# Patient Record
Sex: Female | Born: 1935 | Race: White | Hispanic: No | Smoking: Never smoker
Health system: Southern US, Community
[De-identification: ages and names within clinical notes are randomized; demographics above are authoritative.]

## PROBLEM LIST (undated history)

## (undated) DIAGNOSIS — E039 Hypothyroidism, unspecified: Secondary | ICD-10-CM

## (undated) DIAGNOSIS — R7303 Prediabetes: Secondary | ICD-10-CM

## (undated) DIAGNOSIS — E7800 Pure hypercholesterolemia, unspecified: Secondary | ICD-10-CM

## (undated) DIAGNOSIS — C50919 Malignant neoplasm of unspecified site of unspecified female breast: Secondary | ICD-10-CM

## (undated) DIAGNOSIS — J189 Pneumonia, unspecified organism: Secondary | ICD-10-CM

## (undated) DIAGNOSIS — E785 Hyperlipidemia, unspecified: Secondary | ICD-10-CM

## (undated) DIAGNOSIS — E079 Disorder of thyroid, unspecified: Secondary | ICD-10-CM

## (undated) DIAGNOSIS — N189 Chronic kidney disease, unspecified: Secondary | ICD-10-CM

## (undated) DIAGNOSIS — M109 Gout, unspecified: Secondary | ICD-10-CM

## (undated) DIAGNOSIS — K219 Gastro-esophageal reflux disease without esophagitis: Secondary | ICD-10-CM

## (undated) DIAGNOSIS — N289 Disorder of kidney and ureter, unspecified: Secondary | ICD-10-CM

## (undated) DIAGNOSIS — C801 Malignant (primary) neoplasm, unspecified: Secondary | ICD-10-CM

## (undated) DIAGNOSIS — I10 Essential (primary) hypertension: Secondary | ICD-10-CM

## (undated) HISTORY — DX: Chronic kidney disease, unspecified: N18.9

## (undated) HISTORY — DX: Malignant neoplasm of unspecified site of unspecified female breast: C50.919

## (undated) HISTORY — DX: Prediabetes: R73.03

## (undated) HISTORY — DX: Pneumonia, unspecified organism: J18.9

## (undated) HISTORY — DX: Gastro-esophageal reflux disease without esophagitis: K21.9

## (undated) HISTORY — PX: ABDOMINAL HYSTERECTOMY: SHX81

## (undated) HISTORY — DX: Hyperlipidemia, unspecified: E78.5

## (undated) HISTORY — DX: Hypothyroidism, unspecified: E03.9

---

## 2005-04-10 ENCOUNTER — Ambulatory Visit: Payer: Self-pay | Admitting: Internal Medicine

## 2006-05-29 ENCOUNTER — Ambulatory Visit: Payer: Self-pay | Admitting: Internal Medicine

## 2007-06-02 ENCOUNTER — Ambulatory Visit: Payer: Self-pay | Admitting: Internal Medicine

## 2007-07-23 ENCOUNTER — Ambulatory Visit: Payer: Self-pay | Admitting: Unknown Physician Specialty

## 2007-09-18 HISTORY — PX: BREAST BIOPSY: SHX20

## 2007-09-18 HISTORY — PX: BREAST LUMPECTOMY: SHX2

## 2008-06-28 ENCOUNTER — Ambulatory Visit: Payer: Self-pay | Admitting: Internal Medicine

## 2008-07-02 ENCOUNTER — Ambulatory Visit: Payer: Self-pay | Admitting: Internal Medicine

## 2008-08-05 ENCOUNTER — Ambulatory Visit: Payer: Self-pay | Admitting: Surgery

## 2008-08-17 ENCOUNTER — Ambulatory Visit: Payer: Self-pay | Admitting: Surgery

## 2008-08-17 ENCOUNTER — Ambulatory Visit: Payer: Self-pay | Admitting: Radiation Oncology

## 2008-08-23 ENCOUNTER — Ambulatory Visit: Payer: Self-pay | Admitting: Surgery

## 2008-09-15 ENCOUNTER — Ambulatory Visit: Payer: Self-pay | Admitting: Oncology

## 2008-09-15 DIAGNOSIS — C50919 Malignant neoplasm of unspecified site of unspecified female breast: Secondary | ICD-10-CM

## 2008-09-15 HISTORY — DX: Malignant neoplasm of unspecified site of unspecified female breast: C50.919

## 2008-09-17 ENCOUNTER — Ambulatory Visit: Payer: Self-pay | Admitting: Oncology

## 2008-09-20 ENCOUNTER — Ambulatory Visit: Payer: Self-pay | Admitting: Radiation Oncology

## 2008-10-18 ENCOUNTER — Ambulatory Visit: Payer: Self-pay | Admitting: Oncology

## 2008-10-18 ENCOUNTER — Ambulatory Visit: Payer: Self-pay | Admitting: Radiation Oncology

## 2008-11-15 ENCOUNTER — Ambulatory Visit: Payer: Self-pay | Admitting: Radiation Oncology

## 2008-11-15 ENCOUNTER — Ambulatory Visit: Payer: Self-pay | Admitting: Oncology

## 2008-12-16 ENCOUNTER — Ambulatory Visit: Payer: Self-pay | Admitting: Oncology

## 2008-12-16 ENCOUNTER — Ambulatory Visit: Payer: Self-pay | Admitting: Radiation Oncology

## 2009-01-15 ENCOUNTER — Ambulatory Visit: Payer: Self-pay | Admitting: Oncology

## 2009-05-18 ENCOUNTER — Ambulatory Visit: Payer: Self-pay | Admitting: Oncology

## 2009-05-30 ENCOUNTER — Ambulatory Visit: Payer: Self-pay | Admitting: Oncology

## 2009-06-17 ENCOUNTER — Ambulatory Visit: Payer: Self-pay | Admitting: Oncology

## 2009-06-27 ENCOUNTER — Ambulatory Visit: Payer: Self-pay | Admitting: Oncology

## 2009-07-05 ENCOUNTER — Ambulatory Visit: Payer: Self-pay | Admitting: Surgery

## 2009-07-18 ENCOUNTER — Ambulatory Visit: Payer: Self-pay | Admitting: Oncology

## 2009-11-15 ENCOUNTER — Ambulatory Visit: Payer: Self-pay | Admitting: Oncology

## 2009-11-28 ENCOUNTER — Ambulatory Visit: Payer: Self-pay | Admitting: Oncology

## 2009-12-08 ENCOUNTER — Ambulatory Visit: Payer: Self-pay | Admitting: Unknown Physician Specialty

## 2009-12-08 HISTORY — PX: COLONOSCOPY: SHX174

## 2009-12-16 ENCOUNTER — Ambulatory Visit: Payer: Self-pay | Admitting: Oncology

## 2009-12-26 ENCOUNTER — Ambulatory Visit: Payer: Self-pay | Admitting: Oncology

## 2010-01-15 ENCOUNTER — Ambulatory Visit: Payer: Self-pay | Admitting: Oncology

## 2010-01-17 ENCOUNTER — Ambulatory Visit: Payer: Self-pay | Admitting: Surgery

## 2010-06-27 ENCOUNTER — Ambulatory Visit: Payer: Self-pay | Admitting: Oncology

## 2010-06-28 ENCOUNTER — Ambulatory Visit: Payer: Self-pay | Admitting: Surgery

## 2010-06-29 LAB — CANCER ANTIGEN 27.29: CA 27.29: 9.5 U/mL (ref 0.0–38.6)

## 2010-07-18 ENCOUNTER — Ambulatory Visit: Payer: Self-pay | Admitting: Oncology

## 2010-11-30 ENCOUNTER — Ambulatory Visit: Payer: Self-pay | Admitting: Oncology

## 2010-12-17 ENCOUNTER — Ambulatory Visit: Payer: Self-pay | Admitting: Oncology

## 2011-01-17 ENCOUNTER — Ambulatory Visit: Payer: Self-pay | Admitting: Surgery

## 2011-02-01 ENCOUNTER — Ambulatory Visit: Payer: Self-pay | Admitting: Oncology

## 2011-02-02 LAB — CANCER ANTIGEN 27.29: CA 27.29: 11.7 U/mL (ref 0.0–38.6)

## 2011-02-16 ENCOUNTER — Ambulatory Visit: Payer: Self-pay | Admitting: Oncology

## 2011-07-10 ENCOUNTER — Ambulatory Visit: Payer: Self-pay | Admitting: Nephrology

## 2011-07-25 ENCOUNTER — Ambulatory Visit: Payer: Self-pay | Admitting: Surgery

## 2011-08-15 ENCOUNTER — Ambulatory Visit: Payer: Self-pay | Admitting: Oncology

## 2011-08-18 ENCOUNTER — Ambulatory Visit: Payer: Self-pay | Admitting: Oncology

## 2011-11-29 ENCOUNTER — Ambulatory Visit: Payer: Self-pay | Admitting: Oncology

## 2011-12-17 ENCOUNTER — Ambulatory Visit: Payer: Self-pay | Admitting: Oncology

## 2012-02-13 ENCOUNTER — Ambulatory Visit: Payer: Self-pay | Admitting: Oncology

## 2012-02-13 LAB — COMPREHENSIVE METABOLIC PANEL
Albumin: 3.9 g/dL (ref 3.4–5.0)
BUN: 34 mg/dL — ABNORMAL HIGH (ref 7–18)
Bilirubin,Total: 0.5 mg/dL (ref 0.2–1.0)
EGFR (Non-African Amer.): 28 — ABNORMAL LOW
Glucose: 106 mg/dL — ABNORMAL HIGH (ref 65–99)
Osmolality: 291 (ref 275–301)
Potassium: 4.5 mmol/L (ref 3.5–5.1)
SGPT (ALT): 38 U/L
Total Protein: 7.5 g/dL (ref 6.4–8.2)

## 2012-02-13 LAB — CBC CANCER CENTER
Basophil #: 0.1 x10 3/mm (ref 0.0–0.1)
Basophil %: 1 %
Eosinophil #: 0.2 x10 3/mm (ref 0.0–0.7)
Eosinophil %: 2.6 %
HCT: 37.2 % (ref 35.0–47.0)
Lymphocyte #: 2.1 x10 3/mm (ref 1.0–3.6)
Lymphocyte %: 31.6 %
MCH: 29.9 pg (ref 26.0–34.0)
MCHC: 33.2 g/dL (ref 32.0–36.0)
MCV: 90 fL (ref 80–100)
Monocyte %: 9.7 %
Neutrophil #: 3.7 x10 3/mm (ref 1.4–6.5)
Neutrophil %: 55.1 %
Platelet: 198 x10 3/mm (ref 150–440)
RBC: 4.13 10*6/uL (ref 3.80–5.20)

## 2012-02-16 ENCOUNTER — Ambulatory Visit: Payer: Self-pay | Admitting: Oncology

## 2012-07-30 ENCOUNTER — Ambulatory Visit: Payer: Self-pay | Admitting: Surgery

## 2012-08-18 ENCOUNTER — Ambulatory Visit: Payer: Self-pay | Admitting: Oncology

## 2012-08-18 LAB — COMPREHENSIVE METABOLIC PANEL
Alkaline Phosphatase: 142 U/L — ABNORMAL HIGH (ref 50–136)
BUN: 41 mg/dL — ABNORMAL HIGH (ref 7–18)
Bilirubin,Total: 0.3 mg/dL (ref 0.2–1.0)
Calcium, Total: 10 mg/dL (ref 8.5–10.1)
Co2: 24 mmol/L (ref 21–32)
Creatinine: 1.99 mg/dL — ABNORMAL HIGH (ref 0.60–1.30)
EGFR (Non-African Amer.): 23 — ABNORMAL LOW
Glucose: 98 mg/dL (ref 65–99)
Osmolality: 291 (ref 275–301)
SGPT (ALT): 52 U/L (ref 12–78)
Sodium: 141 mmol/L (ref 136–145)
Total Protein: 7.7 g/dL (ref 6.4–8.2)

## 2012-08-18 LAB — CBC CANCER CENTER
Basophil #: 0.1 x10 3/mm (ref 0.0–0.1)
Basophil %: 1.1 %
HCT: 36.4 % (ref 35.0–47.0)
Lymphocyte %: 33.2 %
MCHC: 33.5 g/dL (ref 32.0–36.0)
MCV: 89 fL (ref 80–100)
Monocyte #: 0.8 x10 3/mm (ref 0.2–0.9)
RDW: 14.2 % (ref 11.5–14.5)

## 2012-09-17 ENCOUNTER — Ambulatory Visit: Payer: Self-pay | Admitting: Oncology

## 2012-09-21 ENCOUNTER — Ambulatory Visit: Payer: Self-pay

## 2012-11-28 ENCOUNTER — Ambulatory Visit: Payer: Self-pay | Admitting: Oncology

## 2012-12-16 ENCOUNTER — Ambulatory Visit: Payer: Self-pay | Admitting: Oncology

## 2013-01-16 ENCOUNTER — Ambulatory Visit: Payer: Self-pay | Admitting: Oncology

## 2013-01-19 LAB — CBC CANCER CENTER
Eosinophil #: 0.2 x10 3/mm (ref 0.0–0.7)
Eosinophil %: 3 %
HCT: 38.2 % (ref 35.0–47.0)
HGB: 13 g/dL (ref 12.0–16.0)
Lymphocyte #: 2 x10 3/mm (ref 1.0–3.6)
Lymphocyte %: 29 %
MCH: 29.7 pg (ref 26.0–34.0)
Monocyte #: 0.6 x10 3/mm (ref 0.2–0.9)
Monocyte %: 9.1 %
Platelet: 212 x10 3/mm (ref 150–440)
RBC: 4.38 10*6/uL (ref 3.80–5.20)
RDW: 14.6 % — ABNORMAL HIGH (ref 11.5–14.5)

## 2013-01-19 LAB — COMPREHENSIVE METABOLIC PANEL
Albumin: 3.8 g/dL (ref 3.4–5.0)
Alkaline Phosphatase: 135 U/L (ref 50–136)
Anion Gap: 13 (ref 7–16)
BUN: 38 mg/dL — ABNORMAL HIGH (ref 7–18)
Bilirubin,Total: 0.4 mg/dL (ref 0.2–1.0)
Calcium, Total: 10 mg/dL (ref 8.5–10.1)
Chloride: 105 mmol/L (ref 98–107)
Creatinine: 1.75 mg/dL — ABNORMAL HIGH (ref 0.60–1.30)
Glucose: 123 mg/dL — ABNORMAL HIGH (ref 65–99)
Osmolality: 294 (ref 275–301)
Potassium: 4.6 mmol/L (ref 3.5–5.1)
Total Protein: 7.7 g/dL (ref 6.4–8.2)

## 2013-02-15 ENCOUNTER — Ambulatory Visit: Payer: Self-pay | Admitting: Oncology

## 2013-07-20 ENCOUNTER — Ambulatory Visit: Payer: Self-pay | Admitting: Oncology

## 2013-07-20 LAB — CBC CANCER CENTER
Basophil #: 0.1 x10 3/mm (ref 0.0–0.1)
Eosinophil #: 0.2 x10 3/mm (ref 0.0–0.7)
Eosinophil %: 2.3 %
HGB: 13.4 g/dL (ref 12.0–16.0)
Lymphocyte #: 3.1 x10 3/mm (ref 1.0–3.6)
Lymphocyte %: 35.2 %
MCHC: 33.1 g/dL (ref 32.0–36.0)
MCV: 91 fL (ref 80–100)
Monocyte %: 8 %
RDW: 13.8 % (ref 11.5–14.5)
WBC: 8.8 x10 3/mm (ref 3.6–11.0)

## 2013-07-20 LAB — COMPREHENSIVE METABOLIC PANEL
Albumin: 4 g/dL (ref 3.4–5.0)
Anion Gap: 11 (ref 7–16)
Calcium, Total: 9.8 mg/dL (ref 8.5–10.1)
Chloride: 103 mmol/L (ref 98–107)
Co2: 25 mmol/L (ref 21–32)
EGFR (African American): 27 — ABNORMAL LOW
EGFR (Non-African Amer.): 24 — ABNORMAL LOW
SGPT (ALT): 46 U/L (ref 12–78)
Sodium: 139 mmol/L (ref 136–145)

## 2013-07-31 ENCOUNTER — Ambulatory Visit: Payer: Self-pay | Admitting: Surgery

## 2013-08-17 ENCOUNTER — Ambulatory Visit: Payer: Self-pay | Admitting: Oncology

## 2013-09-30 ENCOUNTER — Emergency Department: Payer: Self-pay

## 2014-07-19 ENCOUNTER — Ambulatory Visit: Payer: Self-pay | Admitting: Oncology

## 2014-07-19 LAB — COMPREHENSIVE METABOLIC PANEL
ALBUMIN: 4.1 g/dL (ref 3.4–5.0)
ALK PHOS: 98 U/L
Anion Gap: 10 (ref 7–16)
BUN: 39 mg/dL — ABNORMAL HIGH (ref 7–18)
Bilirubin,Total: 0.4 mg/dL (ref 0.2–1.0)
CHLORIDE: 102 mmol/L (ref 98–107)
CREATININE: 1.8 mg/dL — AB (ref 0.60–1.30)
Calcium, Total: 9.9 mg/dL (ref 8.5–10.1)
Co2: 26 mmol/L (ref 21–32)
EGFR (African American): 34 — ABNORMAL LOW
GFR CALC NON AF AMER: 28 — AB
Glucose: 95 mg/dL (ref 65–99)
Osmolality: 285 (ref 275–301)
Potassium: 4.7 mmol/L (ref 3.5–5.1)
SGOT(AST): 38 U/L — ABNORMAL HIGH (ref 15–37)
SGPT (ALT): 46 U/L
SODIUM: 138 mmol/L (ref 136–145)
Total Protein: 7.6 g/dL (ref 6.4–8.2)

## 2014-07-19 LAB — CBC CANCER CENTER
Basophil #: 0.1 x10 3/mm (ref 0.0–0.1)
Basophil %: 1 %
EOS PCT: 4.8 %
Eosinophil #: 0.4 x10 3/mm (ref 0.0–0.7)
HCT: 40.6 % (ref 35.0–47.0)
HGB: 13.4 g/dL (ref 12.0–16.0)
Lymphocyte #: 2.7 x10 3/mm (ref 1.0–3.6)
Lymphocyte %: 33.1 %
MCH: 30.4 pg (ref 26.0–34.0)
MCHC: 33.1 g/dL (ref 32.0–36.0)
MCV: 92 fL (ref 80–100)
MONO ABS: 0.7 x10 3/mm (ref 0.2–0.9)
Monocyte %: 8.6 %
NEUTROS PCT: 52.5 %
Neutrophil #: 4.3 x10 3/mm (ref 1.4–6.5)
PLATELETS: 227 x10 3/mm (ref 150–440)
RBC: 4.41 10*6/uL (ref 3.80–5.20)
RDW: 13.7 % (ref 11.5–14.5)
WBC: 8.1 x10 3/mm (ref 3.6–11.0)

## 2014-08-02 ENCOUNTER — Ambulatory Visit: Payer: Self-pay | Admitting: Family Medicine

## 2014-08-17 ENCOUNTER — Ambulatory Visit: Payer: Self-pay | Admitting: Oncology

## 2014-12-31 ENCOUNTER — Other Ambulatory Visit: Payer: Self-pay | Admitting: Oncology

## 2014-12-31 DIAGNOSIS — Z853 Personal history of malignant neoplasm of breast: Secondary | ICD-10-CM

## 2015-05-20 ENCOUNTER — Emergency Department
Admission: EM | Admit: 2015-05-20 | Discharge: 2015-05-21 | Disposition: A | Discharge status: Home / Self Care | Payer: Medicare Other | Attending: Emergency Medicine | Admitting: Emergency Medicine

## 2015-05-20 ENCOUNTER — Encounter: Payer: Self-pay | Admitting: Medical Oncology

## 2015-05-20 ENCOUNTER — Emergency Department: Payer: Medicare Other

## 2015-05-20 DIAGNOSIS — I10 Essential (primary) hypertension: Secondary | ICD-10-CM | POA: Insufficient documentation

## 2015-05-20 DIAGNOSIS — Y998 Other external cause status: Secondary | ICD-10-CM | POA: Insufficient documentation

## 2015-05-20 DIAGNOSIS — H578 Other specified disorders of eye and adnexa: Secondary | ICD-10-CM | POA: Diagnosis present

## 2015-05-20 DIAGNOSIS — R000 Tachycardia, unspecified: Secondary | ICD-10-CM | POA: Diagnosis not present

## 2015-05-20 DIAGNOSIS — S0502XA Injury of conjunctiva and corneal abrasion without foreign body, left eye, initial encounter: Secondary | ICD-10-CM | POA: Diagnosis not present

## 2015-05-20 DIAGNOSIS — Z79899 Other long term (current) drug therapy: Secondary | ICD-10-CM | POA: Diagnosis not present

## 2015-05-20 DIAGNOSIS — Y93H2 Activity, gardening and landscaping: Secondary | ICD-10-CM | POA: Diagnosis not present

## 2015-05-20 DIAGNOSIS — X58XXXA Exposure to other specified factors, initial encounter: Secondary | ICD-10-CM | POA: Diagnosis not present

## 2015-05-20 DIAGNOSIS — Y92096 Garden or yard of other non-institutional residence as the place of occurrence of the external cause: Secondary | ICD-10-CM | POA: Insufficient documentation

## 2015-05-20 HISTORY — DX: Essential (primary) hypertension: I10

## 2015-05-20 HISTORY — DX: Pure hypercholesterolemia, unspecified: E78.00

## 2015-05-20 HISTORY — DX: Disorder of kidney and ureter, unspecified: N28.9

## 2015-05-20 HISTORY — DX: Malignant (primary) neoplasm, unspecified: C80.1

## 2015-05-20 HISTORY — DX: Disorder of thyroid, unspecified: E07.9

## 2015-05-20 LAB — CBC WITH DIFFERENTIAL/PLATELET
Basophils Absolute: 0.1 10*3/uL (ref 0–0.1)
Basophils Relative: 1 %
EOS ABS: 0.1 10*3/uL (ref 0–0.7)
EOS PCT: 1 %
HCT: 41.4 % (ref 35.0–47.0)
Hemoglobin: 13.7 g/dL (ref 12.0–16.0)
LYMPHS ABS: 2.8 10*3/uL (ref 1.0–3.6)
Lymphocytes Relative: 29 %
MCH: 29.7 pg (ref 26.0–34.0)
MCHC: 33.1 g/dL (ref 32.0–36.0)
MCV: 89.7 fL (ref 80.0–100.0)
MONO ABS: 0.8 10*3/uL (ref 0.2–0.9)
MONOS PCT: 8 %
Neutro Abs: 5.9 10*3/uL (ref 1.4–6.5)
Neutrophils Relative %: 61 %
PLATELETS: 232 10*3/uL (ref 150–440)
RBC: 4.61 MIL/uL (ref 3.80–5.20)
RDW: 14.7 % — AB (ref 11.5–14.5)
WBC: 9.7 10*3/uL (ref 3.6–11.0)

## 2015-05-20 LAB — BASIC METABOLIC PANEL
Anion gap: 8 (ref 5–15)
BUN: 33 mg/dL — AB (ref 6–20)
CALCIUM: 10 mg/dL (ref 8.9–10.3)
CO2: 24 mmol/L (ref 22–32)
CREATININE: 1.71 mg/dL — AB (ref 0.44–1.00)
Chloride: 107 mmol/L (ref 101–111)
GFR calc non Af Amer: 26 mL/min — ABNORMAL LOW (ref >60)
GFR, EST AFRICAN AMERICAN: 30 mL/min — AB (ref >60)
Glucose, Bld: 103 mg/dL — ABNORMAL HIGH (ref 65–99)
Potassium: 4.5 mmol/L (ref 3.5–5.1)
Sodium: 139 mmol/L (ref 135–145)

## 2015-05-20 LAB — TROPONIN I: Troponin I: <0.03 ng/mL (ref <0.031)

## 2015-05-20 MED ORDER — TETRACAINE HCL 0.5 % OP SOLN
2.0000 [drp] | Freq: Once | OPHTHALMIC | Status: AC
  Administered 2015-05-20: 2 [drp] via OPHTHALMIC
  Filled 2015-05-20: qty 2

## 2015-05-20 MED ORDER — LISINOPRIL 20 MG PO TABS
10.0000 mg | ORAL_TABLET | Freq: Once | ORAL | Status: AC
  Administered 2015-05-20: 10 mg via ORAL
  Filled 2015-05-20: qty 1

## 2015-05-20 MED ORDER — AMLODIPINE BESYLATE 5 MG PO TABS
5.0000 mg | ORAL_TABLET | Freq: Once | ORAL | Status: AC
  Administered 2015-05-20: 5 mg via ORAL
  Filled 2015-05-20: qty 1

## 2015-05-20 MED ORDER — METOPROLOL TARTRATE 1 MG/ML IV SOLN
2.5000 mg | Freq: Once | INTRAVENOUS | Status: AC
Start: 2015-05-20 — End: 2015-05-20
  Administered 2015-05-20: 2.5 mg via INTRAVENOUS
  Filled 2015-05-20: qty 5

## 2015-05-20 MED ORDER — IBUPROFEN 600 MG PO TABS
600.0000 mg | ORAL_TABLET | Freq: Once | ORAL | Status: AC
  Administered 2015-05-20: 600 mg via ORAL
  Filled 2015-05-20: qty 1

## 2015-05-20 MED ORDER — ERYTHROMYCIN 5 MG/GM OP OINT
TOPICAL_OINTMENT | Freq: Once | OPHTHALMIC | Status: AC
  Administered 2015-05-20: 1 via OPHTHALMIC
  Filled 2015-05-20: qty 1

## 2015-05-20 MED ORDER — ERYTHROMYCIN 5 MG/GM OP OINT: 1.0000 "application " | TOPICAL_OINTMENT | Freq: Four times a day (QID) | OPHTHALMIC | Status: DC

## 2015-05-20 MED ORDER — ACETAMINOPHEN 325 MG PO TABS
650.0000 mg | ORAL_TABLET | Freq: Once | ORAL | Status: AC
Start: 2015-05-20 — End: 2015-05-20
  Administered 2015-05-20: 650 mg via ORAL
  Filled 2015-05-20: qty 2

## 2015-05-20 MED ORDER — FLUORESCEIN SODIUM 1 MG OP STRP
1.0000 | ORAL_STRIP | Freq: Once | OPHTHALMIC | Status: AC
  Administered 2015-05-20: 1 via OPHTHALMIC
  Filled 2015-05-20: qty 1

## 2015-05-20 NOTE — ED Provider Notes (Addendum)
[facility] Emergency Department Provider Note   ____________________________________________  Time seen: 7pm I have reviewed the triage vital signs and the triage nursing note.  HISTORY  Chief Complaint Foreign Body in [place] Patient  HPI Samantha Bowen is a 81 y.o. female who is complaining of foreign body sensation in left eye. She was gardening today and thinks she got dirt in there. Symptoms are moderate. No additional factors. No other trauma.    Past Medical History  Diagnosis Date  . Hypertension   . High cholesterol   . Thyroid disease   . Renal disorder   . Cancer     Breast Cancer    There are no active problems to display for this patient.   Past Surgical History  Procedure Laterality Date  . Breast lumpectomy      Current Outpatient Rx  Name  Route  Sig  Dispense  Refill  . acetaminophen (TYLENOL) 500 MG tablet   Oral   Take 500 mg by mouth every 6 (six) hours as needed.         Marland Kitchen amLODipine (NORVASC) 5 MG tablet   Oral   Take 1 tablet by mouth daily.         . enalapril (VASOTEC) 20 MG tablet   Oral   Take 1 tablet by mouth 2 (two) times daily.         Marland Kitchen lovastatin (MEVACOR) 20 MG tablet   Oral   Take 1 tablet by mouth daily.         Marland Kitchen SYNTHROID 100 MCG tablet   Oral   Take 1 tablet by mouth daily.           Dispense as written.   Marland Kitchen erythromycin ophthalmic ointment   Left Eye   Place 1 application into the left eye 4 (four) times daily. For 1 week   3.5 g   0     Allergies Cephalexin; Ciprofloxacin; Metronidazole; Sulfa antibiotics; Tetracyclines & related; and Tussionex pennkinetic er  No family history on file.  Social History Social History  Substance Use Topics  . Smoking status: Never Smoker   . Smokeless tobacco: None  . Alcohol Use: No    Review of Systems  Constitutional: Negative for fever. Eyes: Negative for visual changes. ENT: Negative for sore  throat. Cardiovascular: Negative for chest pain. Respiratory: Negative for shortness of breath. Gastrointestinal: Negative for abdominal pain, vomiting and diarrhea. Genitourinary: Negative for dysuria. Musculoskeletal: Negative for back pain. Skin: Negative for rash. Neurological: Negative for headache. 10 point Review of Systems otherwise negative ____________________________________________   PHYSICAL EXAM:  VITAL SIGNS: ED Triage Vitals  Enc Vitals Group     BP 05/20/15 1839 168/76 mmHg     Pulse Rate 05/20/15 1839 136     Resp 05/20/15 1839 20     Temp 05/20/15 1839 98.2 F (36.8 C)     Temp Source 05/20/15 1839 Oral     SpO2 05/20/15 1839 93 %     Weight 05/20/15 1839 139 lb (63.05 kg)     Height 05/20/15 1839 5\' 2"  (1.575 m)     Head Cir --      Peak Flow --      Pain Score 05/20/15 1842 3     Pain Loc --      Pain Edu? --      Excl. in [place]? --      Constitutional: Alert and oriented. Well appearing and in  no distress. Eyes: Conjunctivae are normal. PERRL. Normal extraocular movements. Fluorescein uptake in multiple areas consistent with corneal abrasion over the left cornea and an area of the sclera.   Head: Normocephalic and atraumatic.   Nose: No congestion/rhinnorhea.   Mouth/Throat: Mucous membranes are moist.   Neck: No stridor. Cardiovascular/Chest: Regular, tachycardic. No murmurs, rubs, or gallops. Respiratory: Normal respiratory effort without tachypnea nor retractions. Breath sounds are clear and equal bilaterally. No wheezes/rales/rhonchi. Gastrointestinal: Soft. No distention, no guarding, no rebound. Nontender   Genitourinary/rectal:Deferred Musculoskeletal: Nontender with normal range of motion in all extremities. No joint effusions.  No lower extremity tenderness.  No edema. Neurologic:  Normal speech and language. No gross or focal neurologic deficits are appreciated. Skin:  Skin is warm, dry and intact. No rash noted. Psychiatric:  Mood and affect are normal. Speech and behavior are normal. Patient exhibits appropriate insight and judgment.  ____________________________________________   EKG I, Lisa Roca, MD, the attending physician have personally viewed and interpreted all ECGs.  113 bpm. Sinus tachycardia. Narrow QRS. Normal axis. Normal ST and T-wave. ____________________________________________  LABS (pertinent positives/negatives)  White blood count 9.7, hemoglobin 13.7 and platelet count 614 Metabolic panel and troponin and BNP are pending  ____________________________________________  RADIOLOGY All Xrays were viewed by me. Interpreted by me   Chest x-ray portable: No acute abnormality __________________________________________  PROCEDURES  Procedure(s) performed: Fluorescein staining of the left eye and examination with Wood's lamp. Left cornea numbed with tetracaine. Fluorescein uptake outside of the visual field but over the left lateral cornea. Also some scleral uptake in an abrasion pattern on the left lateral sclera.  Critical Care performed: None  ____________________________________________   ED COURSE / ASSESSMENT AND PLAN  CONSULTATIONS: None  Pertinent labs & imaging results that were available during my care of the patient were reviewed by me and considered in my medical decision making (see chart for details).  Patient's symptoms and exam are consistent with large corneal abrasion. She was given Tylenol initially for pain control followed by tetracaine and examination. Her heart rate was in the 130s with a mildly elevated blood pressure, felt to be related to pain and anxiety about being in the emergency department.  After patient resting and comfortable heart rate still around 115. Daughter is a nurse feels like this is very unusual for her. Patient is asymptomatic without any palpitations, chest pain, shortness of breath, or trouble breathing, dizziness, numbness, or weakness.  Patient was initially given a dose of extra 5 mg tablets of amlodipine, and her nighttime lisinopril, but after one hour there is no significant change or improvement.  We chose to proceed with blood work to rule out anemia, or dehydration, and a chest x-ray as well. Patient will be given one dose of metoprolol IV.  ----------------------------------------- 11:44 PM on 05/20/2015 -----------------------------------------  Patient's heart rate is down to 99 after dose of metoprolol. Patient care transferred to Dr. Kerman Passey at shift change. Labs and x-ray are still pending. If no significant abnormalities, patient will be discharged with my discharge instructions. I have updated the family.  Patient / Family / Caregiver informed of clinical course, medical decision-making process, and agree with plan.   I discussed return precautions, follow-up instructions, and discharged instructions with patient and/or family.  ___________________________________________   FINAL CLINICAL IMPRESSION(S) / ED DIAGNOSES   Final diagnoses:  Conjunctival abrasion, left, initial encounter       Lisa Roca, MD 05/20/15 2346  Lisa Roca, MD 05/20/15 [phone]

## 2015-05-20 NOTE — ED Notes (Signed)
Pt was working in the yard and got something in her left eye.

## 2015-05-20 NOTE — Discharge Instructions (Signed)
You have him to have a scratch and I called a corneal abrasion. He may take over-the-counter Tylenol or ibuprofen as needed to manage pain. Use eye ointment to help prevent infection. Follow up with your eye doctor next week. Next  Return to the emergency department for any worsening condition including double vision, blurry vision, loss of vision, no worsening eye pain, or any other symptoms concerning to you.  Your blood pressure and heart rate were elevated here in the emergency department and no certain cause was found other than related to pain. Follow up with your primary care physician next week for recheck of your blood pressure and heart rate. Return to emergency department for any palpitations, dizziness, passing out, shortness of breath, or chest pain.   Corneal Abrasion The cornea is the clear covering at the front and center of the eye. When looking at the colored portion of the eye (iris), you are looking through the cornea. This very thin tissue is made up of many layers. The surface layer is a single layer of cells (corneal epithelium) and is one of the most sensitive tissues in the body. If a scratch or injury causes the corneal epithelium to come off, it is called a corneal abrasion. If the injury extends to the tissues below the epithelium, the condition is called a corneal ulcer. CAUSES   Scratches.  Trauma.  Foreign body in the eye. Some people have recurrences of abrasions in the area of the original injury even after it has healed (recurrent erosion syndrome). Recurrent erosion syndrome generally improves and goes away with time. SYMPTOMS   Eye pain.  Difficulty or inability to keep the injured eye open.  The eye becomes very sensitive to light.  Recurrent erosions tend to happen suddenly, first thing in the morning, usually after waking up and opening the eye. DIAGNOSIS  Your health care provider can diagnose a corneal abrasion during an eye exam. Dye is usually  placed in the eye using a drop or a small paper strip moistened by your tears. When the eye is examined with a special light, the abrasion shows up clearly because of the dye. TREATMENT   Small abrasions may be treated with antibiotic drops or ointment alone.  A pressure patch may be put over the eye. If this is done, follow your doctor's instructions for when to remove the patch. Do not drive or use machines while the eye patch is on. Judging distances is hard to do with a patch on. If the abrasion becomes infected and spreads to the deeper tissues of the cornea, a corneal ulcer can result. This is serious because it can cause corneal scarring. Corneal scars interfere with light passing through the cornea and cause a loss of vision in the involved eye. HOME CARE INSTRUCTIONS  Use medicine or ointment as directed. Only take over-the-counter or prescription medicines for pain, discomfort, or fever as directed by your health care provider.  Do not drive or operate machinery if your eye is patched. Your ability to judge distances is impaired.  If your health care provider has given you a follow-up appointment, it is very important to keep that appointment. Not keeping the appointment could result in a severe eye infection or permanent loss of vision. If there is any problem keeping the appointment, let your health care provider know. SEEK MEDICAL CARE IF:   You have pain, light sensitivity, and a scratchy feeling in one eye or both eyes.  Your pressure patch keeps loosening  up, and you can blink your eye under the patch after treatment.  Any kind of discharge develops from the eye after treatment or if the lids stick together in the morning.  You have the same symptoms in the morning as you did with the original abrasion days, weeks, or months after the abrasion healed. MAKE SURE YOU:   Understand these instructions.  Will watch your condition.  Will get help right away if you are not doing  well or get worse. Document Released: 08/31/2000 Document Revised: 09/08/2013 Document Reviewed: 05/11/2013 [facility] Patient Information 2015 [place], [place]. This information is not intended to replace advice given to you by your health care provider. Make sure you discuss any questions you have with your health care provider.

## 2015-05-21 LAB — BRAIN NATRIURETIC PEPTIDE: B Natriuretic Peptide: 35 pg/mL (ref 0.0–100.0)

## 2015-05-21 NOTE — ED Provider Notes (Signed)
-----------------------------------------   12:27 AM on 05/21/2015 -----------------------------------------  Labs are within normal limits. Vitals remained well, current heart rate 98 bpm. We will discharge the patient home with erythromycin ointment for her corneal abrasion. I discussed the results with the patient is agreeable to this plan.  Harvest Dark, MD 05/21/15 [phone]

## 2015-08-04 ENCOUNTER — Other Ambulatory Visit: Payer: Self-pay

## 2015-08-08 ENCOUNTER — Other Ambulatory Visit: Payer: Self-pay

## 2015-08-08 ENCOUNTER — Inpatient Hospital Stay: Payer: Medicare Other | Admitting: Oncology

## 2015-08-26 ENCOUNTER — Other Ambulatory Visit: Payer: Self-pay | Admitting: *Deleted

## 2015-08-26 DIAGNOSIS — C50919 Malignant neoplasm of unspecified site of unspecified female breast: Secondary | ICD-10-CM

## 2015-08-29 ENCOUNTER — Encounter: Payer: Self-pay | Admitting: Oncology

## 2015-08-29 ENCOUNTER — Inpatient Hospital Stay: Payer: Medicare Other

## 2015-08-29 ENCOUNTER — Inpatient Hospital Stay: Payer: Medicare Other | Attending: Oncology | Admitting: Oncology

## 2015-08-29 VITALS — BP 134/80 | HR 105 | Temp 97.5°F | Resp 18 | Wt 138.9 lb

## 2015-08-29 DIAGNOSIS — E7800 Pure hypercholesterolemia, unspecified: Secondary | ICD-10-CM | POA: Diagnosis not present

## 2015-08-29 DIAGNOSIS — Z9223 Personal history of estrogen therapy: Secondary | ICD-10-CM

## 2015-08-29 DIAGNOSIS — K219 Gastro-esophageal reflux disease without esophagitis: Secondary | ICD-10-CM

## 2015-08-29 DIAGNOSIS — E079 Disorder of thyroid, unspecified: Secondary | ICD-10-CM | POA: Diagnosis not present

## 2015-08-29 DIAGNOSIS — I10 Essential (primary) hypertension: Secondary | ICD-10-CM | POA: Diagnosis not present

## 2015-08-29 DIAGNOSIS — Z9012 Acquired absence of left breast and nipple: Secondary | ICD-10-CM | POA: Insufficient documentation

## 2015-08-29 DIAGNOSIS — Z853 Personal history of malignant neoplasm of breast: Secondary | ICD-10-CM | POA: Diagnosis present

## 2015-08-29 DIAGNOSIS — F0390 Unspecified dementia without behavioral disturbance: Secondary | ICD-10-CM | POA: Insufficient documentation

## 2015-08-29 DIAGNOSIS — H9190 Unspecified hearing loss, unspecified ear: Secondary | ICD-10-CM | POA: Diagnosis not present

## 2015-08-29 DIAGNOSIS — Z79899 Other long term (current) drug therapy: Secondary | ICD-10-CM | POA: Diagnosis not present

## 2015-08-29 DIAGNOSIS — N289 Disorder of kidney and ureter, unspecified: Secondary | ICD-10-CM | POA: Diagnosis not present

## 2015-08-29 DIAGNOSIS — Z170 Estrogen receptor positive status [ER+]: Secondary | ICD-10-CM | POA: Diagnosis not present

## 2015-08-29 DIAGNOSIS — Z923 Personal history of irradiation: Secondary | ICD-10-CM | POA: Insufficient documentation

## 2015-08-29 DIAGNOSIS — C50919 Malignant neoplasm of unspecified site of unspecified female breast: Secondary | ICD-10-CM

## 2015-08-29 LAB — COMPREHENSIVE METABOLIC PANEL
ALK PHOS: 78 U/L (ref 38–126)
ALT: 36 U/L (ref 14–54)
ANION GAP: 6 (ref 5–15)
AST: 40 U/L (ref 15–41)
Albumin: 4.3 g/dL (ref 3.5–5.0)
BUN: 37 mg/dL — ABNORMAL HIGH (ref 6–20)
CALCIUM: 9.6 mg/dL (ref 8.9–10.3)
CO2: 24 mmol/L (ref 22–32)
CREATININE: 1.52 mg/dL — AB (ref 0.44–1.00)
Chloride: 102 mmol/L (ref 101–111)
GFR, EST AFRICAN AMERICAN: 34 mL/min — AB (ref >60)
GFR, EST NON AFRICAN AMERICAN: 30 mL/min — AB (ref >60)
Glucose, Bld: 89 mg/dL (ref 65–99)
Potassium: 4.8 mmol/L (ref 3.5–5.1)
SODIUM: 132 mmol/L — AB (ref 135–145)
Total Bilirubin: 0.5 mg/dL (ref 0.3–1.2)
Total Protein: 7.3 g/dL (ref 6.5–8.1)

## 2015-08-29 LAB — CBC WITH DIFFERENTIAL/PLATELET
Basophils Absolute: 0.1 10*3/uL (ref 0–0.1)
Basophils Relative: 1 %
EOS ABS: 0.3 10*3/uL (ref 0–0.7)
EOS PCT: 4 %
HCT: 39.7 % (ref 35.0–47.0)
HEMOGLOBIN: 13.2 g/dL (ref 12.0–16.0)
LYMPHS ABS: 2.3 10*3/uL (ref 1.0–3.6)
LYMPHS PCT: 31 %
MCH: 30.3 pg (ref 26.0–34.0)
MCHC: 33.2 g/dL (ref 32.0–36.0)
MCV: 91.3 fL (ref 80.0–100.0)
MONOS PCT: 9 %
Monocytes Absolute: 0.7 10*3/uL (ref 0.2–0.9)
Neutro Abs: 4 10*3/uL (ref 1.4–6.5)
Neutrophils Relative %: 55 %
PLATELETS: 220 10*3/uL (ref 150–440)
RBC: 4.35 MIL/uL (ref 3.80–5.20)
RDW: 15.2 % — ABNORMAL HIGH (ref 11.5–14.5)
WBC: 7.4 10*3/uL (ref 3.6–11.0)

## 2015-08-29 NOTE — Progress Notes (Signed)
[place] @ [facility] Telephone:([phone]  Fax:([phone]) [phone]     Samantha Bowen OB: Jun 13, 1926  MR#: [id number]  IZT#:[id number]  No care team member to display  CHIEF COMPLAINT: [place] Chief Complaint  Patient presents with  . Breast Cancer    Chief Complaint/Diagnosis:   1. Carcinoma of breast. AJCC Staging: , [id number] Stage Grouping:1c Cancer Status: No evidence of disease. ER/PR positive, HER-2 receptor negative. Diagnosis in December of 2009 which lumpectomy and radiation therapy stage Ic disease. has finished anti-hormonal therapy in October of 2014 HPI:    INTERVAL HISTORY:   81-YEAR-OLD LADY CAME TODAY FURTHER FOLLOW-UP REGARDING CARCINOMA OF BREAST. Patient is hard of hearing.  Does not remember whether mammogram was done or not has progressive dementia.  No bony pain no chills no fever.  REVIEW OF SYSTEMS:   GENERAL:  Feels good.  Active.  No fevers, sweats or weight loss.progressive dementia PERFORMANCE STATUS (ECOG): 01 HEENT:  No visual changes, runny nose, sore throat, mouth sores or tenderness. Lungs: No shortness of breath or cough.  No hemoptysis. Cardiac:  No chest pain, palpitations, orthopnea, or PND. GI:  No nausea, vomiting, diarrhea, constipation, melena or hematochezia. GU:  No urgency, frequency, dysuria, or hematuria. Musculoskeletal:  No back pain.  No joint pain.  No muscle tenderness. Extremities:  No pain or swelling. Skin:  No rashes or skin changes. Neuro:  No headache, numbness or weakness, balance or coordination issues. Endocrine:  No diabetes, thyroid issues, hot flashes or night sweats. Psych:  No mood changes, depression or anxiety. Pain:  No focal pain. Review of systems:  All other systems reviewed and found to be negative. As per HPI. Otherwise, a complete review of systems is negatve.  PAST MEDICAL HISTORY: Past Medical History  Diagnosis Date  . Hypertension   . High cholesterol   . Thyroid disease   .  Renal disorder   . Cancer [facility])     Breast Cancer    PAST SURGICAL HISTORY: Past Surgical History  Procedure Laterality Date  . Breast lumpectomy      FAMILY HISTORY No family history on file.  ADVANCED DIRECTIVES:  No flowsheet data found.  HEALTH MAINTENANCE: Social History  Substance Use Topics  . Smoking status: Never Smoker   . Smokeless tobacco: None  . 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 Mammography   Last Mammography: Nov 2013   Smoking History: Smoking History Never Smoked.  PFSH: Comments: No family history of colorectal cancer, breast cancer, or ovarian cancer.  Comments: does not smoke does not drink  Additional Past Medical and Surgical History: has been reviewed from previous notes .     Allergies  Allergen Reactions  . Cephalexin Other (See Comments)  . Ciprofloxacin Other (See Comments)  . Metronidazole Other (See Comments)  . Sulfa Antibiotics Other (See Comments)  . Tetracyclines & Related Other (See Comments)  . Tussionex Pennkinetic Er [Hydrocod Polst-Cpm Polst Er] Other (See Comments)    Current Outpatient Prescriptions  Medication Sig Dispense Refill  .  acetaminophen (TYLENOL) 500 MG tablet Take 500 mg by mouth every 6 (six) hours as needed.    Marland Kitchen amLODipine (NORVASC) 5 MG tablet Take 1 tablet by mouth daily.    . enalapril (VASOTEC) 20 MG tablet Take 1 tablet by mouth 2 (two) times daily.    Marland Kitchen erythromycin ophthalmic ointment Place 1 application into the left eye 4 (four) times daily. For 1 week 3.5 g 0  . SYNTHROID 100 MCG tablet  Take 1 tablet by mouth daily.    Marland Kitchen lovastatin (MEVACOR) 20 MG tablet Take 1 tablet by mouth daily.     No current facility-administered medications for this visit.    OBJECTIVE:  Filed Vitals:   08/29/15 1404  BP: 134/80  Pulse: 105  Temp: 97.5 F (36.4 C)  Resp: 18     Body mass index is 25.4 kg/(m^2).    ECOG FS:1 - Symptomatic but completely ambulatory  PHYSICAL EXAM: GENERAL:  Well developed, well nourished, sitting comfortably in the exam room in no acute distress. MENTAL STATUS:  Alert and oriented to person, place and time.   Patient   is hard of hearing RESPIRATORY:  Clear to auscultation without rales, wheezes or rhonchi. CARDIOVASCULAR:  Regular rate and rhythm without murmur, rub or gallop. BREAST:  Right breast without masses, skin changes or nipple discharge.  Left breast without masses, skin changes or nipple discharge. ABDOMEN:  Soft, non-tender, with active bowel sounds, and no hepatosplenomegaly.  No masses. BACK:  No CVA tenderness.  No tenderness on percussion of the back or rib cage. SKIN:  No rashes, ulcers or lesions. EXTREMITIES: No edema, no skin discoloration or tenderness.  No palpable cords. LYMPH NODES: No palpable cervical, supraclavicular, axillary or inguinal adenopathy  NEUROLOGICAL: Unremarkable. PSYCH:  Appropriate.   LAB RESULTS:  CBC Latest Ref Rng 08/29/2015 05/20/2015  WBC 3.6 - 11.0 K/uL 7.4 9.7  Hemoglobin 12.0 - 16.0 g/dL 13.2 13.7  Hematocrit 35.0 - 47.0 % 39.7 41.4  Platelets 150 - 440 K/uL 220 232    Appointment on 08/29/2015  Component Date Value Ref Range Status  . WBC 08/29/2015 7.4  3.6 - 11.0 K/uL Final  . RBC 08/29/2015 4.35  3.80 - 5.20 MIL/uL Final  . Hemoglobin 08/29/2015 13.2  12.0 - 16.0 g/dL Final  . HCT 08/29/2015 39.7  35.0 - 47.0 % Final  . MCV 08/29/2015 91.3  80.0 - 100.0 fL Final  . MCH 08/29/2015 30.3  26.0 - 34.0 pg Final  . MCHC 08/29/2015 33.2  32.0 - 36.0 g/dL Final  . RDW 08/29/2015 15.2* 11.5 - 14.5  % Final  . Platelets 08/29/2015 220  150 - 440 K/uL Final  . Neutrophils Relative % 08/29/2015 55   Final  . Neutro Abs 08/29/2015 4.0  1.4 - 6.5 K/uL Final  . Lymphocytes Relative 08/29/2015 31   Final  . Lymphs Abs 08/29/2015 2.3  1.0 - 3.6 K/uL Final  . Monocytes Relative 08/29/2015 9   Final  . Monocytes Absolute 08/29/2015 0.7  0.2 - 0.9 K/uL Final  . Eosinophils Relative 08/29/2015 4   Final  . Eosinophils Absolute 08/29/2015 0.3  0 - 0.7 K/uL Final  . Basophils Relative 08/29/2015 1   Final  . Basophils Absolute 08/29/2015 0.1  0 - 0.1 K/uL Final       ASSESSMENT: Carcinoma of breast ,there is no evidence of recurrent disease. Patient is off anti-hormonal therapy Patient remembers getting mammogram done however we do not have any record available.  If mammogram is not done this year and will get screening mammogram .  Will see patient in one year for reevaluation All lab data has been reviewed.  There is mild renal insufficiency.    Patient expressed understanding and was in agreement with this plan. She also understands that She can call clinic at any time with any questions, concerns, or complaints.    No matching staging information was found for the patient.  Forest Gleason, MD   08/29/2015 2:11 PM

## 2015-09-18 DIAGNOSIS — J189 Pneumonia, unspecified organism: Secondary | ICD-10-CM

## 2015-09-18 HISTORY — DX: Pneumonia, unspecified organism: J18.9

## 2015-09-21 ENCOUNTER — Ambulatory Visit
Admission: RE | Admit: 2015-09-21 | Discharge: 2015-09-21 | Disposition: A | Discharge status: Home / Self Care | Payer: Medicare Other | Source: Ambulatory Visit | Attending: Oncology | Admitting: Oncology

## 2015-09-21 DIAGNOSIS — Z853 Personal history of malignant neoplasm of breast: Secondary | ICD-10-CM | POA: Diagnosis present

## 2015-09-21 DIAGNOSIS — Z9889 Other specified postprocedural states: Secondary | ICD-10-CM | POA: Diagnosis not present

## 2015-09-21 HISTORY — DX: Malignant neoplasm of unspecified site of unspecified female breast: C50.919

## 2016-01-15 ENCOUNTER — Emergency Department: Payer: Medicare Other

## 2016-01-15 ENCOUNTER — Inpatient Hospital Stay
Admission: EM | Admit: 2016-01-15 | Discharge: 2016-01-16 | DRG: 871 | Disposition: A | Discharge status: Home / Self Care | Payer: Medicare Other | Attending: Internal Medicine | Admitting: Internal Medicine

## 2016-01-15 DIAGNOSIS — E785 Hyperlipidemia, unspecified: Secondary | ICD-10-CM | POA: Diagnosis present

## 2016-01-15 DIAGNOSIS — Z888 Allergy status to other drugs, medicaments and biological substances status: Secondary | ICD-10-CM | POA: Diagnosis not present

## 2016-01-15 DIAGNOSIS — Z79899 Other long term (current) drug therapy: Secondary | ICD-10-CM

## 2016-01-15 DIAGNOSIS — J9601 Acute respiratory failure with hypoxia: Secondary | ICD-10-CM | POA: Diagnosis present

## 2016-01-15 DIAGNOSIS — N390 Urinary tract infection, site not specified: Secondary | ICD-10-CM | POA: Diagnosis not present

## 2016-01-15 DIAGNOSIS — Z9889 Other specified postprocedural states: Secondary | ICD-10-CM

## 2016-01-15 DIAGNOSIS — Z886 Allergy status to analgesic agent status: Secondary | ICD-10-CM

## 2016-01-15 DIAGNOSIS — M109 Gout, unspecified: Secondary | ICD-10-CM | POA: Diagnosis present

## 2016-01-15 DIAGNOSIS — R7989 Other specified abnormal findings of blood chemistry: Secondary | ICD-10-CM | POA: Diagnosis present

## 2016-01-15 DIAGNOSIS — K760 Fatty (change of) liver, not elsewhere classified: Secondary | ICD-10-CM | POA: Diagnosis present

## 2016-01-15 DIAGNOSIS — Z882 Allergy status to sulfonamides status: Secondary | ICD-10-CM

## 2016-01-15 DIAGNOSIS — R000 Tachycardia, unspecified: Secondary | ICD-10-CM | POA: Diagnosis present

## 2016-01-15 DIAGNOSIS — E039 Hypothyroidism, unspecified: Secondary | ICD-10-CM | POA: Diagnosis present

## 2016-01-15 DIAGNOSIS — I10 Essential (primary) hypertension: Secondary | ICD-10-CM | POA: Diagnosis present

## 2016-01-15 DIAGNOSIS — J181 Lobar pneumonia, unspecified organism: Secondary | ICD-10-CM

## 2016-01-15 DIAGNOSIS — R945 Abnormal results of liver function studies: Secondary | ICD-10-CM

## 2016-01-15 DIAGNOSIS — Z923 Personal history of irradiation: Secondary | ICD-10-CM | POA: Diagnosis not present

## 2016-01-15 DIAGNOSIS — Z66 Do not resuscitate: Secondary | ICD-10-CM | POA: Diagnosis present

## 2016-01-15 DIAGNOSIS — A419 Sepsis, unspecified organism: Secondary | ICD-10-CM | POA: Diagnosis not present

## 2016-01-15 DIAGNOSIS — Z853 Personal history of malignant neoplasm of breast: Secondary | ICD-10-CM | POA: Diagnosis not present

## 2016-01-15 DIAGNOSIS — J189 Pneumonia, unspecified organism: Secondary | ICD-10-CM | POA: Diagnosis present

## 2016-01-15 LAB — URINALYSIS COMPLETE WITH MICROSCOPIC (ARMC ONLY)
Bilirubin Urine: NEGATIVE
GLUCOSE, UA: NEGATIVE mg/dL
KETONES UR: NEGATIVE mg/dL
Nitrite: NEGATIVE
Protein, ur: NEGATIVE mg/dL
SPECIFIC GRAVITY, URINE: 1.015 (ref 1.005–1.030)
pH: 5 (ref 5.0–8.0)

## 2016-01-15 LAB — CBC WITH DIFFERENTIAL/PLATELET
BASOS ABS: 0.1 10*3/uL (ref 0–0.1)
Basophils Relative: 1 %
EOS ABS: 0.1 10*3/uL (ref 0–0.7)
Eosinophils Relative: 1 %
HCT: 35.7 % (ref 35.0–47.0)
Hemoglobin: 11.8 g/dL — ABNORMAL LOW (ref 12.0–16.0)
LYMPHS ABS: 2 10*3/uL (ref 1.0–3.6)
Lymphocytes Relative: 16 %
MCH: 29.9 pg (ref 26.0–34.0)
MCHC: 33.2 g/dL (ref 32.0–36.0)
MCV: 90.1 fL (ref 80.0–100.0)
MONO ABS: 1 10*3/uL — AB (ref 0.2–0.9)
MONOS PCT: 8 %
NEUTROS ABS: 9.6 10*3/uL — AB (ref 1.4–6.5)
Neutrophils Relative %: 74 %
PLATELETS: 285 10*3/uL (ref 150–440)
RBC: 3.96 MIL/uL (ref 3.80–5.20)
RDW: 15.3 % — AB (ref 11.5–14.5)
WBC: 12.8 10*3/uL — AB (ref 3.6–11.0)

## 2016-01-15 LAB — COMPREHENSIVE METABOLIC PANEL
ALBUMIN: 3.8 g/dL (ref 3.5–5.0)
ALK PHOS: 137 U/L — AB (ref 38–126)
ALT: 166 U/L — AB (ref 14–54)
AST: 231 U/L — AB (ref 15–41)
Anion gap: 11 (ref 5–15)
BILIRUBIN TOTAL: 1 mg/dL (ref 0.3–1.2)
BUN: 42 mg/dL — AB (ref 6–20)
CALCIUM: 9.7 mg/dL (ref 8.9–10.3)
CO2: 24 mmol/L (ref 22–32)
CREATININE: 1.79 mg/dL — AB (ref 0.44–1.00)
Chloride: 102 mmol/L (ref 101–111)
GFR calc Af Amer: 28 mL/min — ABNORMAL LOW (ref >60)
GFR, EST NON AFRICAN AMERICAN: 24 mL/min — AB (ref >60)
GLUCOSE: 100 mg/dL — AB (ref 65–99)
Potassium: 4.3 mmol/L (ref 3.5–5.1)
Sodium: 137 mmol/L (ref 135–145)
TOTAL PROTEIN: 8 g/dL (ref 6.5–8.1)

## 2016-01-15 LAB — LACTIC ACID, PLASMA
LACTIC ACID, VENOUS: 0.8 mmol/L (ref 0.5–2.0)
Lactic Acid, Venous: 1.1 mmol/L (ref 0.5–2.0)

## 2016-01-15 LAB — TROPONIN I: TROPONIN I: 0.04 ng/mL — AB (ref <0.031)

## 2016-01-15 MED ORDER — PANTOPRAZOLE SODIUM 40 MG PO TBEC
40.0000 mg | DELAYED_RELEASE_TABLET | Freq: Every day | ORAL | Status: DC
  Administered 2016-01-16: 09:00:00 40 mg via ORAL
  Filled 2016-01-15: qty 1

## 2016-01-15 MED ORDER — SODIUM CHLORIDE 0.9% FLUSH
3.0000 mL | Freq: Two times a day (BID) | INTRAVENOUS | Status: DC
  Administered 2016-01-15: 3 mL via INTRAVENOUS

## 2016-01-15 MED ORDER — DEXTROSE 5 % IV SOLN
1.0000 g | Freq: Once | INTRAVENOUS | Status: AC
  Administered 2016-01-15: 1 g via INTRAVENOUS
  Filled 2016-01-15: qty 10

## 2016-01-15 MED ORDER — PRAVASTATIN SODIUM 20 MG PO TABS: 10.0000 mg | ORAL_TABLET | Freq: Every day | ORAL | Status: DC

## 2016-01-15 MED ORDER — ACETAMINOPHEN 500 MG PO TABS: 500.0000 mg | ORAL_TABLET | Freq: Four times a day (QID) | ORAL | Status: DC | PRN

## 2016-01-15 MED ORDER — ENALAPRIL MALEATE 5 MG PO TABS
20.0000 mg | ORAL_TABLET | Freq: Two times a day (BID) | ORAL | Status: DC
  Administered 2016-01-15 – 2016-01-16 (×2): 20 mg via ORAL
  Filled 2016-01-15 (×2): qty 4

## 2016-01-15 MED ORDER — LEVOTHYROXINE SODIUM 100 MCG PO TABS
100.0000 ug | ORAL_TABLET | Freq: Every day | ORAL | Status: DC
  Administered 2016-01-16: 09:00:00 100 ug via ORAL
  Filled 2016-01-15: qty 1

## 2016-01-15 MED ORDER — ONDANSETRON HCL 4 MG PO TABS: 4.0000 mg | ORAL_TABLET | Freq: Four times a day (QID) | ORAL | Status: DC | PRN

## 2016-01-15 MED ORDER — AMLODIPINE BESYLATE 5 MG PO TABS
5.0000 mg | ORAL_TABLET | Freq: Every day | ORAL | Status: DC
  Administered 2016-01-16: 5 mg via ORAL
  Filled 2016-01-15: qty 1

## 2016-01-15 MED ORDER — AZITHROMYCIN 500 MG IV SOLR
500.0000 mg | Freq: Once | INTRAVENOUS | Status: AC
  Administered 2016-01-15: 500 mg via INTRAVENOUS
  Filled 2016-01-15: qty 500

## 2016-01-15 MED ORDER — SODIUM CHLORIDE 0.9 % IV SOLN
INTRAVENOUS | Status: DC
  Administered 2016-01-15 – 2016-01-16 (×2): via INTRAVENOUS

## 2016-01-15 MED ORDER — SODIUM CHLORIDE 0.9 % IV BOLUS (SEPSIS)
1000.0000 mL | Freq: Once | INTRAVENOUS | Status: AC
  Administered 2016-01-15: 1000 mL via INTRAVENOUS

## 2016-01-15 MED ORDER — ENOXAPARIN SODIUM 30 MG/0.3ML ~~LOC~~ SOLN
30.0000 mg | SUBCUTANEOUS | Status: DC
  Administered 2016-01-15: 23:00:00 30 mg via SUBCUTANEOUS
  Filled 2016-01-15: qty 0.3

## 2016-01-15 MED ORDER — GUAIFENESIN-DM 100-10 MG/5ML PO SYRP: 10.0000 mL | ORAL_SOLUTION | Freq: Four times a day (QID) | ORAL | Status: DC | PRN

## 2016-01-15 MED ORDER — ONDANSETRON HCL 4 MG/2ML IJ SOLN: 4.0000 mg | Freq: Four times a day (QID) | INTRAMUSCULAR | Status: DC | PRN

## 2016-01-15 MED ORDER — ALLOPURINOL 100 MG PO TABS
100.0000 mg | ORAL_TABLET | Freq: Every day | ORAL | Status: DC
  Administered 2016-01-16: 09:00:00 100 mg via ORAL
  Filled 2016-01-15: qty 1

## 2016-01-15 NOTE — ED Notes (Signed)
Patient transported to Ultrasound 

## 2016-01-15 NOTE — ED Provider Notes (Signed)
[facility] Emergency Department Provider Note   ____________________________________________  Time seen:  I have reviewed the triage vital signs and the triage nursing note.  HISTORY  Chief Complaint Weakness   Historian Patient and daughter  HPI Samantha Bowen is a 82 y.o. female who lives at home with her elderly husband, and reportedly has been weak and had decreased by mouth intake for 3 or 4 days. She's also had a cough. No described productive sputum. Low-grade temperatures at home. No focal weakness, generalized weakness and trouble getting up and walking around on her own. Symptoms are moderate. Her daughter brought her in today. Exerting herself seems to make it worse.    Past Medical History  Diagnosis Date  . Hypertension   . High cholesterol   . Thyroid disease   . Renal disorder   . Cancer [facility])     Breast Cancer  . Breast cancer ([place]) 09/15/2008    lumpectomy with Radiation    There are no active problems to display for this patient.   Past Surgical History  Procedure Laterality Date  . Breast lumpectomy Left 2009    with radiation  . Breast biopsy Left 2009    stereo Bx   +    Current Outpatient Rx  Name  Route  Sig  Dispense  Refill  . acetaminophen (TYLENOL) 500 MG tablet   Oral   Take 500 mg by mouth every 6 (six) hours as needed.         Marland Kitchen amLODipine (NORVASC) 5 MG tablet   Oral   Take 1 tablet by mouth daily.         . enalapril (VASOTEC) 20 MG tablet   Oral   Take 1 tablet by mouth 2 (two) times daily.         Marland Kitchen erythromycin ophthalmic ointment   Left Eye   Place 1 application into the left eye 4 (four) times daily. For 1 week   3.5 g   0   . lovastatin (MEVACOR) 20 MG tablet   Oral   Take 1 tablet by mouth daily.         Marland Kitchen SYNTHROID 100 MCG tablet   Oral   Take 1 tablet by mouth daily.           Dispense as written.     Allergies Cephalexin; Ciprofloxacin; Metronidazole; Sulfa  antibiotics; Tetracyclines & related; and Tussionex pennkinetic er  Family History  Problem Relation Age of Onset  . Breast cancer Neg Hx     Social History Social History  Substance Use Topics  . Smoking status: Never Smoker   . Smokeless tobacco: Not on file  . Alcohol Use: No    Review of Systems  Constitutional: Positive for low-grade fever. Eyes: Negative for visual changes. ENT: Negative for sore throat. Cardiovascular: Negative for chest pain. Respiratory: Positive for shortness of breath. Gastrointestinal: Negative for abdominal pain, vomiting and diarrhea. Genitourinary: Negative for dysuria.  Decreased urine output. Musculoskeletal: Negative for back pain. Skin: Negative for rash. Neurological: Negative for headache. 10 point Review of Systems otherwise negative ____________________________________________   PHYSICAL EXAM:  VITAL SIGNS: ED Triage Vitals  Enc Vitals Group     BP 01/15/16 1559 134/71 mmHg     Pulse Rate 01/15/16 1559 131     Resp 01/15/16 1559 52     Temp 01/15/16 1559 99.5 F (37.5 C)     Temp Source 01/15/16 1559 Oral     SpO2  01/15/16 1559 91 %     Weight 01/15/16 1559 135 lb (61.236 kg)     Height 01/15/16 1559 5\' 3"  (1.6 m)     Head Cir --      Peak Flow --      Pain Score --      Pain Loc --      Pain Edu? --      Excl. in [place]? --      Constitutional: Alert and oriented. Well appearing overall and in no distress. HEENT   Head: Normocephalic and atraumatic.      Eyes: Conjunctivae are normal. PERRL. Normal extraocular movements.      Ears:         Nose: No congestion/rhinnorhea.   Mouth/Throat: Mucous membranes are moist.   Neck: No stridor. Cardiovascular/Chest: Tachycardic, regular rhythm.  No murmurs, rubs, or gallops. Respiratory: Tachypnea without retractions. Decreased breath sounds throughout, no wheezing. Gastrointestinal: Soft. No distention, no guarding, no rebound. Nontender.     Genitourinary/rectal:Deferred Musculoskeletal: Nontender with normal range of motion in all extremities. No joint effusions.  No lower extremity tenderness.  No edema. Neurologic:  Normal speech and language. No gross or focal neurologic deficits are appreciated. Skin:  Skin is warm, dry and intact. No rash noted. Psychiatric: Mood and affect are normal. Speech and behavior are normal. Patient exhibits appropriate insight and judgment.  ____________________________________________   EKG I, Lisa Roca, MD, the attending physician have personally viewed and interpreted all ECGs.  122 bpm. Sinus tachycardia. Narrow QRS. Normal axis. Normal ST and T-wave ____________________________________________  LABS (pertinent positives/negatives)  Comprehensive metabolic panel significant for BUN 42 and creatinine 1.79 AST 231, a LT 166, alkaline phosphatase 137, total bili 1.0 White blood count 12.8, hemoglobin 11.8 and platelet count 285 Lactate 1.1 Troponin 0.04  ____________________________________________  RADIOLOGY All Xrays were viewed by me. Imaging interpreted by Radiologist.  Chest portable:  IMPRESSION: Chronic interstitial lung disease with more focal right lower lobe airspace disease concerning for superimposed pneumonia.  RUQ Ultrasound:  Pending __________________________________________  PROCEDURES  Procedure(s) performed: None  Critical Care performed: None  ____________________________________________   ED COURSE / ASSESSMENT AND PLAN  Pertinent labs & imaging results that were available during my care of the patient were reviewed by me and considered in my medical decision making (see chart for details).   I was called to see the patient out of concern for a code sepsis with elevated respiratory rate, trouble breathing/shortness of breath as well as generalized weakness.  No evidence of focal weakness. Her O2 sat is 90% on room air which is what sounds like  hypoxic for her as she does not have any known lung issues.  Her chest x-ray is consistent with right lower lobe pneumonia. Her white blood count is elevated 12.8. She has a low-grade temperature. Could sepsis was initiated, I did treat her within about expiratory acquired pneumonia with Rocephin and azithromycin. Although there was report of an allergy to Keflex, it was written in her notes reviewed by the daughter that she has had Keflex in the past with no problem. Unclear allergy listed to Cipro, and so I was awaiting Levaquin.  LFTs were elevated, I will obtain an ruq ultrasound.  She will need hospital admission for increased respiratory rate and concern for early sepsis with pneumonia. She is also essentially too weak to walk around on her right now. Her BUN and creatinine are about at prior baseline.  RUQ u/s pending.   CONSULTATIONS:  Hospitalist for admission.   Patient / Family / Caregiver informed of clinical course, medical decision-making process, and agree with plan.    ___________________________________________   FINAL CLINICAL IMPRESSION(S) / ED DIAGNOSES   Final diagnoses:  Right lower lobe pneumonia              Note: This dictation was prepared with Dragon dictation. Any transcriptional errors that result from this process are unintentional   Lisa Roca, MD 01/18/16 [phone]

## 2016-01-15 NOTE — ED Notes (Signed)
Transported to xray 

## 2016-01-15 NOTE — H&P (Signed)
[facility] [place] NAME: Samantha Bowen    MR#:  [id number]  DATE OF BIRTH:  1925/11/25  DATE OF ADMISSION:  01/15/2016  PRIMARY CARE PHYSICIAN: Dion Body, MD   REQUESTING/REFERRING PHYSICIAN: Dr. Reita Cliche  CHIEF COMPLAINT:  Cough and shortness of breath  HISTORY OF PRESENT ILLNESS:  Samantha Bowen  is a 82 y.o. female with a known history of essential hypertension, hyperlipidemia, hypothyroidism and gout is presenting to the ED with a chief complaint of cough and shortness of breath. Patient was hypoxic with a 90% on room air. Chest x-ray has revealed right-sided pneumonia. Patient was started on IV Rocephin and Zithromax and hospitalist team is called to admit the patient. Patient was tachycardic and white count is elevated. Reporting right-sided chest pain while coughing.  PAST MEDICAL HISTORY:   Past Medical History  Diagnosis Date  . Hypertension   . High cholesterol   . Thyroid disease   . Renal disorder   . Cancer [facility])     Breast Cancer  . Breast cancer ([place]) 09/15/2008    lumpectomy with Radiation    PAST SURGICAL HISTOIRY:   Past Surgical History  Procedure Laterality Date  . Breast lumpectomy Left 2009    with radiation  . Breast biopsy Left 2009    stereo Bx   +    SOCIAL HISTORY:   Social History  Substance Use Topics  . Smoking status: Never Smoker   . Smokeless tobacco: Not on file  . Alcohol Use: No    FAMILY HISTORY:   Family History  Problem Relation Age of Onset  . Breast cancer Neg Hx     DRUG ALLERGIES:   Allergies  Allergen Reactions  . Cephalexin Other (See Comments)  . Ciprofloxacin Other (See Comments)  . Metronidazole Other (See Comments)  . Sulfa Antibiotics Other (See Comments)  . Sulfamethoxazole-Trimethoprim     Other reaction(s): Unknown  . Tetracyclines & Related Other (See Comments)  . Tussionex Pennkinetic Er [Hydrocod Polst-Cpm Polst Er] Other (See Comments)     REVIEW OF SYSTEMS:  CONSTITUTIONAL: No fever, fatigue . reporting generalized weakness.  EYES: No blurred or double vision.  EARS, NOSE, AND THROAT: No tinnitus or ear pain.  RESPIRATORY: Reporting cough, shortness of breath, denies wheezing or hemoptysis.  CARDIOVASCULAR: No chest pain, orthopnea, edema.  GASTROINTESTINAL: Has history of elevated LFTs No nausea, vomiting, diarrhea or abdominal pain.  GENITOURINARY: No dysuria, hematuria.  ENDOCRINE: No polyuria, nocturia,  HEMATOLOGY: No anemia, easy bruising or bleeding SKIN: No rash or lesion. MUSCULOSKELETAL: No joint pain or arthritis.   NEUROLOGIC: No tingling, numbness, weakness.  PSYCHIATRY: No anxiety or depression.   MEDICATIONS AT HOME:   Prior to Admission medications   Medication Sig Start Date End Date Taking? Authorizing Provider  acetaminophen (TYLENOL) 500 MG tablet Take 500 mg by mouth every 6 (six) hours as needed for mild pain, moderate pain or fever.    Yes Historical Provider, MD  allopurinol (ZYLOPRIM) 100 MG tablet Take 100 mg by mouth daily. 11/15/15  Yes Historical Provider, MD  amLODipine (NORVASC) 5 MG tablet Take 5 mg by mouth daily.  03/24/15  Yes Historical Provider, MD  enalapril (VASOTEC) 20 MG tablet Take 20 mg by mouth 2 (two) times daily.  04/13/15  Yes Historical Provider, MD  levothyroxine (SYNTHROID, LEVOTHROID) 100 MCG tablet Take 100 mcg by mouth daily before breakfast. *Take 30 to 60 minutes before breakfast*.   Yes Historical Provider,  MD  lovastatin (MEVACOR) 40 MG tablet Take 40 mg by mouth at bedtime. 10/25/15  Yes Historical Provider, MD  omeprazole (PRILOSEC OTC) 20 MG tablet Take 20 mg by mouth daily as needed. For heartburn/indigestion.   Yes Historical Provider, MD  erythromycin ophthalmic ointment Place 1 application into the left eye 4 (four) times daily. For 1 week 05/20/15   Lisa Roca, MD      VITAL SIGNS:  Blood pressure 138/78, pulse 111, temperature 99.5 F (37.5 C),  temperature source Oral, resp. rate 22, height 5\' 3"  (1.6 m), weight 61.236 kg (135 lb), SpO2 96 %.  PHYSICAL EXAMINATION:  GENERAL:  82 y.o.-year-old patient lying in the bed with no acute distress.  EYES: Pupils equal, round, reactive to light and accommodation. No scleral icterus. Extraocular muscles intact.  HEENT: Head atraumatic, normocephalic. Oropharynx and nasopharynx clear.  NECK:  Supple, no jugular venous distention. No thyroid enlargement, no tenderness.  LUNGS: Moderate breath sounds bilaterally, right-sided crackles, no wheezing, rales,rhonchi . No use of accessory muscles of respiration.  CARDIOVASCULAR: S1, S2 normal. No murmurs, rubs, or gallops.  ABDOMEN: Soft, nontender, nondistended. Bowel sounds present. No organomegaly or mass.  EXTREMITIES: No pedal edema, cyanosis, or clubbing.  NEUROLOGIC: Cranial nerves II through XII are intact. Muscle strength 5/5 in all extremities. Sensation intact. Gait not checked.  PSYCHIATRIC: The patient is alert and oriented x 3.  SKIN: No obvious rash, lesion, or ulcer.   LABORATORY PANEL:   CBC  Recent Labs Lab 01/15/16 1612  WBC 12.8*  HGB 11.8*  HCT 35.7  PLT 285   ------------------------------------------------------------------------------------------------------------------  Chemistries   Recent Labs Lab 01/15/16 1612  NA 137  K 4.3  CL 102  CO2 24  GLUCOSE 100*  BUN 42*  CREATININE 1.79*  CALCIUM 9.7  AST 231*  ALT 166*  ALKPHOS 137*  BILITOT 1.0   ------------------------------------------------------------------------------------------------------------------  Cardiac Enzymes  Recent Labs Lab 01/15/16 1612  TROPONINI 0.04*   ------------------------------------------------------------------------------------------------------------------  RADIOLOGY:  Dg Chest 2 View  01/15/2016  CLINICAL DATA:  Status post fall.  Low grade temperature. EXAM: CHEST  2 VIEW COMPARISON:  05/20/2015 FINDINGS:  There is bilateral chronic interstitial lung disease more severe of the bases. There is more focal right lower lobe airspace disease concerning for superimposed infection. There is no pleural effusion or pneumothorax. The heart and mediastinal contours are unremarkable. The osseous structures are unremarkable. IMPRESSION: Chronic interstitial lung disease with more focal right lower lobe airspace disease concerning for superimposed pneumonia. Electronically Signed   By: Kathreen Devoid   On: 01/15/2016 17:02   US Abdomen Limited Ruq  01/15/2016  CLINICAL DATA:  Right upper quadrant pain, abnormal LFTs EXAM: US ABDOMEN LIMITED - RIGHT UPPER QUADRANT COMPARISON:  None. FINDINGS: Gallbladder: No gallstones, gallbladder wall thickening, or pericholecystic fluid. Negative sonographic Murphy's sign. Common bile duct: Diameter: 3 mm Liver: Hyperechoic hepatic parenchyma.  No focal hepatic lesion is seen. Additional comments: Multiple right renal cysts measuring up to 4.5 cm. IMPRESSION: Hyperechoic hepatic parenchyma, suggesting hepatic steatosis. Electronically Signed   By: Julian Hy M.D.   On: 01/15/2016 19:58    EKG:   Orders placed or performed during the hospital encounter of 01/15/16  . ED EKG 12-Lead  . ED EKG 12-Lead    IMPRESSION AND PLAN:  Samantha Bowen  is a 82 y.o. female with a known history of essential hypertension, hyperlipidemia, hypothyroidism and gout is presenting to the ED with a chief complaint of cough and shortness of  breath. Patient was hypoxic with a 90% on room air. Chest x-ray has revealed right-sided pneumonia. Patient was started on IV Rocephin and Zithromax and hospitalist team is called to admit the patient.  #sepsis secondary to right-sided pneumonia Patient meets septic criteria with leukocytosis, tachycardia Will provide IV antibiotics Rocephin and azithromycin Antitussives as needed IV fluids Obtain sputum culture and sensitivity Breathing treatments as  needed  #History of hypothyroidism continue Synthroid which is her home medication  #Gout continue allopurinol  #Right-sided chest pain which is pleuritic from underlying pneumonia Please give Tylenol only as requested by the patient and her daughter  #Elevated LFTs Repeat CMP in a.m. Right upper quadrant ultrasound reveals hepatic steatosis Patient denies any abdominal pain. Continue close monitoring.  Provide GI and DVT prophylaxis.  All the records are reviewed and case discussed with ED provider. Management plans discussed with the patient, family and they are in agreement.  CODE STATUS: DO NOT RESUSCITATE, husband and daughter is the healthcare power of attorney  TOTAL TIME TAKING CARE OF THIS PATIENT: 45 minutes.    Nicholes Mango M.D on 01/15/2016 at 8:55 PM  Between 7am to 6pm - Pager - [phone]  After 6pm go to [email or web address] - password [email or web address] [facility]  Office  [phone]  CC: Primary care physician; Dion Body, MD

## 2016-01-15 NOTE — ED Notes (Signed)
Attempt to call report x 1  

## 2016-01-15 NOTE — ED Notes (Signed)
Fell at home on Thursday, needed the fire dept's assistance in getting her up, has been weak, loss of appetite and coughing with  A low grade temp since.

## 2016-01-16 LAB — CBC
HCT: 32 % — ABNORMAL LOW (ref 35.0–47.0)
Hemoglobin: 10.7 g/dL — ABNORMAL LOW (ref 12.0–16.0)
MCH: 30.4 pg (ref 26.0–34.0)
MCHC: 33.6 g/dL (ref 32.0–36.0)
MCV: 90.6 fL (ref 80.0–100.0)
PLATELETS: 223 10*3/uL (ref 150–440)
RBC: 3.53 MIL/uL — ABNORMAL LOW (ref 3.80–5.20)
RDW: 15.1 % — AB (ref 11.5–14.5)
WBC: 10.9 10*3/uL (ref 3.6–11.0)

## 2016-01-16 LAB — COMPREHENSIVE METABOLIC PANEL
ALK PHOS: 110 U/L (ref 38–126)
ALT: 110 U/L — AB (ref 14–54)
AST: 115 U/L — AB (ref 15–41)
Albumin: 2.7 g/dL — ABNORMAL LOW (ref 3.5–5.0)
Anion gap: 9 (ref 5–15)
BILIRUBIN TOTAL: 1.1 mg/dL (ref 0.3–1.2)
BUN: 33 mg/dL — AB (ref 6–20)
CALCIUM: 9 mg/dL (ref 8.9–10.3)
CO2: 22 mmol/L (ref 22–32)
CREATININE: 1.44 mg/dL — AB (ref 0.44–1.00)
Chloride: 107 mmol/L (ref 101–111)
GFR calc Af Amer: 37 mL/min — ABNORMAL LOW (ref >60)
GFR, EST NON AFRICAN AMERICAN: 32 mL/min — AB (ref >60)
Glucose, Bld: 102 mg/dL — ABNORMAL HIGH (ref 65–99)
POTASSIUM: 4.4 mmol/L (ref 3.5–5.1)
Sodium: 138 mmol/L (ref 135–145)
TOTAL PROTEIN: 6.2 g/dL — AB (ref 6.5–8.1)

## 2016-01-16 MED ORDER — AZITHROMYCIN 500 MG PO TABS: 500.0000 mg | ORAL_TABLET | Freq: Every day | ORAL | Status: DC

## 2016-01-16 MED ORDER — DEXTROSE 5 % IV SOLN
1.0000 g | Freq: Once | INTRAVENOUS | Status: DC
  Filled 2016-01-16: qty 10

## 2016-01-16 MED ORDER — DEXTROSE 5 % IV SOLN
500.0000 mg | Freq: Once | INTRAVENOUS | Status: DC
  Filled 2016-01-16: qty 500

## 2016-01-16 NOTE — Progress Notes (Signed)
Patient discharged home with home health. Prescriptions given to patient. All discharge instructions given and all questions answered. 

## 2016-01-16 NOTE — Care Management (Signed)
Admitted to this facility with the diagnosis of pneumonia. Lives with husband, Homer [phone]). Daughter is Adela Lank [phone]). No home health. No skilled facility. No home oxygen. Last seen Dr. Netty Starring in January. Rolling walker, bedside commode, and shower chair in the home. Takes care of all basic activities of daily living herself, drives if needed.  Physical therapy evaluation completed. Recommends home with home health and physical therapy. Discussed home health agencies. [facility]. Vista Lawman, [facility] representative updated.  Discharge to home today per Dr. Benjie Karvonen. Shelbie Ammons RN MSN CCM Care Management [phone]

## 2016-01-16 NOTE — Care Management Important Message (Signed)
Important Message  Patient Details  Name: Samantha Bowen MRN: [id number] Date of Birth: 06-10-1926   Medicare Important Message Given:  Yes    Shelbie Ammons, RN 01/16/2016, 11:40 AM

## 2016-01-16 NOTE — Discharge Summary (Signed)
[place] [facility] [place] NAME: Samantha Bowen    MR#:  [id number]  DATE OF BIRTH:  July 11, 1926  DATE OF ADMISSION:  01/15/2016 ADMITTING PHYSICIAN: Nicholes Mango, MD  DATE OF DISCHARGE: 01/16/2016  PRIMARY CARE PHYSICIAN: Dion Body, MD    ADMISSION DIAGNOSIS:  UTI (lower urinary tract infection) [N39.0] Right lower lobe pneumonia [J18.9] LFTs abnormal [R79.89]  DISCHARGE DIAGNOSIS:  Active Problems:   Pneumonia   SECONDARY DIAGNOSIS:   Past Medical History  Diagnosis Date  . Hypertension   . High cholesterol   . Thyroid disease   . Renal disorder   . Cancer [facility])     Breast Cancer  . Breast cancer ([place]) 09/15/2008    lumpectomy with Radiation    HOSPITAL COURSE:   82 year female with essential hypertension and hyperlipidemia who presented with cough and shortness of breath and found to have community-acquired pneumonia. For further details please refer the H&P.  1. Acute hypoxic respiratory failure: This is due to community-acquired pneumonia. Patient was weaned off of oxygen.  2. Community-acquired pneumonia. Patient was treated with Rocephin and azithromycin while in the hospital. Patient will be discharged with azithromycin.  3. Sepsis: Patient presented with leukocytosis and tachycardia. Next line sepsis due to community acquired pneumonia.  Sepsis is resolved.  4. Hypothyroid: Continue Synthroid.  5. Right-sided chest pain: This is pleuritic in nature from community-acquired pneumonia.  6. Elevated liver function tests: Right upper quadrant ultrasound shows hepatic steatosis. Patient should follow up with her primary care physician.   DISCHARGE CONDITIONS AND DIET:   Patient is stable for discharge on a regular diet  CONSULTS OBTAINED:     DRUG ALLERGIES:   Allergies  Allergen Reactions  . Cephalexin Other (See Comments)  . Ciprofloxacin Other (See Comments)  . Metronidazole Other (See Comments)  . Sulfa  Antibiotics Other (See Comments)  . Sulfamethoxazole-Trimethoprim     Other reaction(s): Unknown  . Tetracyclines & Related Other (See Comments)  . Tussionex Pennkinetic Er [Hydrocod Polst-Cpm Polst Er] Other (See Comments)    DISCHARGE MEDICATIONS:   Current Discharge Medication List    START taking these medications   Details  azithromycin (ZITHROMAX) 500 MG tablet Take 1 tablet (500 mg total) by mouth daily. Qty: 7 tablet, Refills: 0      CONTINUE these medications which have NOT CHANGED   Details  acetaminophen (TYLENOL) 500 MG tablet Take 500 mg by mouth every 6 (six) hours as needed for mild pain, moderate pain or fever.     allopurinol (ZYLOPRIM) 100 MG tablet Take 100 mg by mouth daily.    amLODipine (NORVASC) 5 MG tablet Take 5 mg by mouth daily.     enalapril (VASOTEC) 20 MG tablet Take 20 mg by mouth 2 (two) times daily.     levothyroxine (SYNTHROID, LEVOTHROID) 100 MCG tablet Take 100 mcg by mouth daily before breakfast. *Take 30 to 60 minutes before breakfast*.    lovastatin (MEVACOR) 40 MG tablet Take 40 mg by mouth at bedtime.    omeprazole (PRILOSEC OTC) 20 MG tablet Take 20 mg by mouth daily as needed. For heartburn/indigestion.    erythromycin ophthalmic ointment Place 1 application into the left eye 4 (four) times daily. For 1 week Qty: 3.5 g, Refills: 0              Today   CHIEF COMPLAINT:  Patient doing well this point. Patient wants to go home. Patient denies chest pain or  cough.   VITAL SIGNS:  Blood pressure 113/52, pulse 95, temperature 99.1 F (37.3 C), temperature source Oral, resp. rate 18, height 5\' 3"  (1.6 m), weight 59.875 kg (132 lb), SpO2 96 %.   REVIEW OF SYSTEMS:  Review of Systems  Constitutional: Negative for fever, chills and malaise/fatigue.  HENT: Negative for ear discharge, ear pain, hearing loss, nosebleeds and sore throat.   Eyes: Negative for blurred vision and pain.  Respiratory: Negative for cough,  hemoptysis, shortness of breath and wheezing.   Cardiovascular: Negative for chest pain, palpitations and leg swelling.  Gastrointestinal: Negative for nausea, vomiting, abdominal pain, diarrhea and blood in stool.  Genitourinary: Negative for dysuria.  Musculoskeletal: Negative for back pain.  Neurological: Negative for dizziness, tremors, speech change, focal weakness, seizures and headaches.  Endo/Heme/Allergies: Does not bruise/bleed easily.  Psychiatric/Behavioral: Negative for depression, suicidal ideas and hallucinations.     PHYSICAL EXAMINATION:  GENERAL:  82 y.o.-year-old patient lying in the bed with no acute distress.  NECK:  Supple, no jugular venous distention. No thyroid enlargement, no tenderness.  LUNGS: Normal breath sounds bilaterally, no wheezing, rales,rhonchi  No use of accessory muscles of respiration.  CARDIOVASCULAR: S1, S2 normal. No murmurs, rubs, or gallops.  ABDOMEN: Soft, non-tender, non-distended. Bowel sounds present. No organomegaly or mass.  EXTREMITIES: No pedal edema, cyanosis, or clubbing.  PSYCHIATRIC: The patient is alert and oriented x 3.  SKIN: No obvious rash, lesion, or ulcer.   DATA REVIEW:   CBC  Recent Labs Lab 01/16/16 0418  WBC 10.9  HGB 10.7*  HCT 32.0*  PLT 223    Chemistries   Recent Labs Lab 01/16/16 0418  NA 138  K 4.4  CL 107  CO2 22  GLUCOSE 102*  BUN 33*  CREATININE 1.44*  CALCIUM 9.0  AST 115*  ALT 110*  ALKPHOS 110  BILITOT 1.1    Cardiac Enzymes  Recent Labs Lab 01/15/16 1612  TROPONINI 0.04*    Microbiology Results  @MICRORSLT48 @  RADIOLOGY:  Dg Chest 2 View  01/15/2016  CLINICAL DATA:  Status post fall.  Low grade temperature. EXAM: CHEST  2 VIEW COMPARISON:  05/20/2015 FINDINGS: There is bilateral chronic interstitial lung disease more severe of the bases. There is more focal right lower lobe airspace disease concerning for superimposed infection. There is no pleural effusion or  pneumothorax. The heart and mediastinal contours are unremarkable. The osseous structures are unremarkable. IMPRESSION: Chronic interstitial lung disease with more focal right lower lobe airspace disease concerning for superimposed pneumonia. Electronically Signed   By: Kathreen Devoid   On: 01/15/2016 17:02   US Abdomen Limited Ruq  01/15/2016  CLINICAL DATA:  Right upper quadrant pain, abnormal LFTs EXAM: US ABDOMEN LIMITED - RIGHT UPPER QUADRANT COMPARISON:  None. FINDINGS: Gallbladder: No gallstones, gallbladder wall thickening, or pericholecystic fluid. Negative sonographic Murphy's sign. Common bile duct: Diameter: 3 mm Liver: Hyperechoic hepatic parenchyma.  No focal hepatic lesion is seen. Additional comments: Multiple right renal cysts measuring up to 4.5 cm. IMPRESSION: Hyperechoic hepatic parenchyma, suggesting hepatic steatosis. Electronically Signed   By: Julian Hy M.D.   On: 01/15/2016 19:58      Management plans discussed with the patient and she is in agreement. Stable for discharge   Patient should follow up with PCP in 1 week  CODE STATUS:     Code Status Orders        Start     Ordered   01/16/16 0044  Limited resuscitation (code)  Continuous    Question Answer Comment  In the event of cardiac or respiratory ARREST: Initiate Code Blue, Call Rapid Response Yes   In the event of cardiac or respiratory ARREST: Perform CPR No   In the event of cardiac or respiratory ARREST: Perform Intubation/Mechanical Ventilation Yes   In the event of cardiac or respiratory ARREST: Use NIPPV/BiPAp only if indicated Yes   In the event of cardiac or respiratory ARREST: Administer ACLS medications if indicated Yes   In the event of cardiac or respiratory ARREST: Perform Defibrillation or Cardioversion if indicated Yes      01/16/16 0044    Code Status History    Date Active Date Inactive Code Status Order ID Comments User Context   01/15/2016 10:38 PM 01/16/2016 12:44 AM DNR  [id number]  Nicholes Mango, MD Inpatient    Advance Directive Documentation        Most Recent Value   Type of Advance Directive  Healthcare Power of Attorney   Pre-existing out of facility DNR order (yellow form or pink MOST form)     "MOST" Form in Place?        TOTAL TIME TAKING CARE OF THIS PATIENT: 35 minutes.    Note: This dictation was prepared with Dragon dictation along with smaller phrase technology. Any transcriptional errors that result from this process are unintentional.  Bronte Sabado M.D on 01/16/2016 at 11:54 AM  Between 7am to 6pm - Pager - [phone] After 6pm go to [email or web address] - password [email or web address] [facility]  Office  [phone]  CC: Primary care physician; Dion Body, MD

## 2016-01-16 NOTE — Progress Notes (Signed)
Contacted by nursing that patient's family state that patient has paperwork for partial code status.  They state that patient wants all resuscitative measures EXCEPT chest compressions.  I expressed the futility in setting of cardiac arrest of administering ACLS therapy without chest compressions.  Patient's family states that such are the patient's documented wishes, and that they will provide supporting documentation.  Partial Code order placed.  Jacqulyn Bath [facility] 01/16/2016, 12:51 AM

## 2016-01-16 NOTE — Evaluation (Signed)
Physical Therapy Evaluation Patient Details Name: Samantha Bowen MRN: [id number] DOB: June 21, 1926 Today's Date: 01/16/2016   History of Present Illness  Pt is an 82 y.o. female presenting to hospital with cough, SOB (pt hypoxic to 90's on RA), and s/p fall last Thursday.  Pt admitted with R sided PNA.  PMH includes htn, gout, breast CA.  Clinical Impression  Prior to admission, pt was independent ambulating without AD (although pt's family reports pt tends to hold onto furniture to walk).  Pt lives with her husband in 1 level home with stairs to enter.  Currently pt is CGA with transfers and gait with RW and also navigating 4 stairs with railing(s).  Pt would benefit from skilled PT to address noted impairments and functional limitations.  Recommend pt discharge to home with support of family (pt's husband is there 24/7 and pt's daughter able to assist for a day), HHPT, and SBA for functional mobility with use of RW for safety when medically appropriate.     Follow Up Recommendations Home health PT;Supervision for mobility/OOB    Equipment Recommendations   (pt's family reports having RW at home already they wanted to use and PT educated them on how to appropriate fit it to pt)    Recommendations for Other Services       Precautions / Restrictions Precautions Precautions: Fall Restrictions Weight Bearing Restrictions: No      Mobility  Bed Mobility Overal bed mobility: Modified Independent             General bed mobility comments: Supine to/from sit with HOB elevated  Transfers Overall transfer level: Needs assistance Equipment used: Rolling walker (2 wheeled) Transfers: Sit to/from [facility] Sit to Stand: Min guard Stand pivot transfers: Min guard (transfer to toilet in bathroom)       General transfer comment: steady without loss of balance  Ambulation/Gait Ambulation/Gait assistance: Min guard Ambulation Distance (Feet):  (100 feet; 20 feet to  bathroom; 40 feet to get to/from stairs) Assistive device: Rolling walker (2 wheeled) Gait Pattern/deviations: Step-through pattern Gait velocity: decreased   General Gait Details: steady without loss of balance  Stairs Stairs: Yes Stairs assistance: Min guard Stair Management:  (B rails ascending; R rail descending with B UE support) Number of Stairs: 4 General stair comments: step to; steady and strong without loss of balance  Wheelchair Mobility    Modified Rankin (Stroke Patients Only)       Balance Overall balance assessment: History of Falls;Needs assistance Sitting-balance support: Bilateral upper extremity supported;Feet supported Sitting balance-Leahy Scale: Normal     Standing balance support: Bilateral upper extremity supported (on RW) Standing balance-Leahy Scale: Good                               Pertinent Vitals/Pain Pain Assessment: 0-10 Pain Score: 1  Pain Location: L ribs Pain Descriptors / Indicators: Sore Pain Intervention(s): Limited activity within patient's tolerance;Monitored during session;Repositioned  See flow sheet for vitals.    Home Living Family/patient expects to be discharged to:: Private residence Living Arrangements: Spouse/significant other Available Help at Discharge: Family Type of Home: House Home Access: Stairs to enter   [employer] of Steps: 2 STE and uses L railing and R door frame Home Layout: One level [place]: [place] - 2 wheels      Prior Function Level of Independence: Independent         Comments: Per pt's family,  pt appears to "furniture cruise" ambulating in home and pt and her husband work together to maintain their independence.  Pt does report h/o 2 falls in past 6 months.     Hand Dominance        Extremity/Trunk Assessment   Upper Extremity Assessment: Generalized weakness           Lower Extremity Assessment: Generalized weakness         Communication    Communication: HOH  Cognition Arousal/Alertness: Awake/alert Behavior During Therapy: WFL for tasks assessed/performed Overall Cognitive Status: Within Functional Limits for tasks assessed                      General Comments   Nursing cleared pt for participation in physical therapy.  Pt agreeable to PT session.  Pt required clean-up (pt stood for this) d/t incontinent of bowel and then after getting cleaned up pt requested to go to bathroom.    Exercises        Assessment/Plan    PT Assessment Patient needs continued PT services  PT Diagnosis Generalized weakness;Difficulty walking   PT Problem List Decreased strength;Decreased activity tolerance;Decreased balance;Decreased mobility  PT Treatment Interventions DME instruction;Gait training;Stair training;Functional mobility training;Therapeutic activities;Therapeutic exercise;Balance training;Patient/family education   PT Goals (Current goals can be found in the Care Plan section) Acute Rehab PT Goals Patient Stated Goal: to go home PT Goal Formulation: With patient Time For Goal Achievement: 01/30/16 Potential to Achieve Goals: Good    Frequency Min 2X/week   Barriers to discharge        Co-evaluation               End of Session Equipment Utilized During Treatment: Gait belt Activity Tolerance: Patient tolerated treatment well Patient left: in chair;with call bell/phone within reach;with chair alarm set;with family/visitor present Nurse Communication: Mobility status;Precautions         Time: [id number] PT Time Calculation (min) (ACUTE ONLY): 55 min   Charges:   PT Evaluation $PT Eval Low Complexity: 1 Procedure PT Treatments $Gait Training: 8-22 mins $Therapeutic Activity: 23-37 mins   PT G CodesLeitha Bleak Feb 04, 2016, 2:29 PM Leitha Bleak, [place]

## 2016-01-18 LAB — URINE CULTURE: Culture: >=100000 — AB

## 2016-01-20 LAB — CULTURE, BLOOD (ROUTINE X 2)
Culture: NO GROWTH
Culture: NO GROWTH

## 2016-08-26 DIAGNOSIS — C50912 Malignant neoplasm of unspecified site of left female breast: Secondary | ICD-10-CM | POA: Insufficient documentation

## 2016-08-26 NOTE — Progress Notes (Signed)
[place]  Telephone:([phone]) [phone] [phone]) [phone]  ID: Samantha Bowen OB: 09-09-1926  MR#: [id number]  XLK#:[id number]  Patient Care Team: Dion Body, MD as PCP - General (Family Medicine)  CHIEF COMPLAINT: Pathologic stage Ia ER/PR positive, HER-2 negative invasive carcinoma of the left breast, unspecified location.  INTERVAL HISTORY: Patient is an 82 year old female who returns to clinic today for routine yearly follow-up. She currently feels well and is asymptomatic. She has no neurologic complaints. She is hard of hearing. She has no recent fevers or illnesses. She has a good appetite and denies weight loss. She has no chest pain or shortness of breath. She denies any nausea, vomiting, constipation, or diarrhea. She has no urinary complaints. Patient feels at her baseline and offers no specific complaints today.  REVIEW OF SYSTEMS:   Review of Systems  Constitutional: Negative.  Negative for fever, malaise/fatigue and weight loss.  HENT: Positive for hearing loss.   Respiratory: Negative.  Negative for cough and shortness of breath.   Cardiovascular: Negative.  Negative for chest pain and leg swelling.  Gastrointestinal: Negative.  Negative for abdominal pain.  Neurological: Negative.  Negative for weakness.  Psychiatric/Behavioral: Negative.  The patient is not nervous/anxious.     As per HPI. Otherwise, a complete review of systems is negative.  PAST MEDICAL HISTORY: Past Medical History:  Diagnosis Date  . Breast cancer ([place]) 09/15/2008   lumpectomy with Radiation  . Cancer [facility])    Breast Cancer  . High cholesterol   . Hypertension   . Renal disorder   . Thyroid disease     PAST SURGICAL HISTORY: Past Surgical History:  Procedure Laterality Date  . BREAST BIOPSY Left 2009   stereo Bx   +  . BREAST LUMPECTOMY Left 2009   with radiation    FAMILY HISTORY: Family History  Problem Relation Age of Onset  . Breast cancer Neg Hx      ADVANCED DIRECTIVES (Y/N):  N  HEALTH MAINTENANCE: Social History  Substance Use Topics  . Smoking status: Never Smoker  . Smokeless tobacco: Not on file  . Alcohol use No     Colonoscopy:  PAP:  Bone density:  Lipid panel:  Allergies  Allergen Reactions  . Cephalexin Other (See Comments)  . Ciprofloxacin Other (See Comments)  . Hydrocodone-Chlorpheniramine Other (See Comments)    Caused excessive sleepiness  . Metronidazole Other (See Comments)  . Sulfa Antibiotics Other (See Comments)  . Sulfamethoxazole-Trimethoprim     Other reaction(s): Unknown  . Tetracyclines & Related Other (See Comments)  . Tussionex Pennkinetic Er [Hydrocod Polst-Cpm Polst Er] Other (See Comments)    Current Outpatient Prescriptions  Medication Sig Dispense Refill  . acetaminophen (TYLENOL) 500 MG tablet Take 500 mg by mouth every 6 (six) hours as needed for mild pain, moderate pain or fever.     Marland Kitchen allopurinol (ZYLOPRIM) 100 MG tablet Take 100 mg by mouth daily.    Marland Kitchen amLODipine (NORVASC) 5 MG tablet Take 5 mg by mouth daily.     . enalapril (VASOTEC) 20 MG tablet Take 20 mg by mouth 2 (two) times daily.     Marland Kitchen erythromycin ophthalmic ointment Place 1 application into the left eye 4 (four) times daily. For 1 week 3.5 g 0  . levothyroxine (SYNTHROID, LEVOTHROID) 100 MCG tablet Take 100 mcg by mouth daily before breakfast. *Take 30 to 60 minutes before breakfast*.    Marland Kitchen lovastatin (MEVACOR) 40 MG tablet Take 40 mg by mouth at bedtime.    Marland Kitchen  omeprazole (PRILOSEC OTC) 20 MG tablet Take 20 mg by mouth daily as needed. For heartburn/indigestion.     No current facility-administered medications for this visit.     OBJECTIVE: Vitals:   08/28/16 1440  BP: 135/77  Pulse: (!) 121  Resp: 18  Temp: 98.4 F (36.9 C)     Body mass index is 24.6 kg/m.    ECOG FS:0 - Asymptomatic  General: Well-developed, well-nourished, no acute distress. Eyes: Pink conjunctiva, anicteric sclera. Breasts: Bilateral  breast and axilla without lumps or masses. Lungs: Clear to auscultation bilaterally. Heart: Regular rate and rhythm. No rubs, murmurs, or gallops. Abdomen: Soft, nontender, nondistended. No organomegaly noted, normoactive bowel sounds. Musculoskeletal: No edema, cyanosis, or clubbing. Neuro: Alert, answering all questions appropriately. Cranial nerves grossly intact. Skin: No rashes or petechiae noted. Psych: Normal affect.   LAB RESULTS:  Lab Results  Component Value Date   NA 138 08/28/2016   K 4.4 08/28/2016   CL 105 08/28/2016   CO2 24 08/28/2016   GLUCOSE 103 (H) 08/28/2016   BUN 37 (H) 08/28/2016   CREATININE 1.55 (H) 08/28/2016   CALCIUM 10.1 08/28/2016   PROT 7.5 08/28/2016   ALBUMIN 4.4 08/28/2016   AST 40 08/28/2016   ALT 36 08/28/2016   ALKPHOS 104 08/28/2016   BILITOT 0.8 08/28/2016   GFRNONAA 29 (L) 08/28/2016   GFRAA 33 (L) 08/28/2016    Lab Results  Component Value Date   WBC 9.6 08/28/2016   NEUTROABS 5.6 08/28/2016   HGB 13.3 08/28/2016   HCT 40.1 08/28/2016   MCV 91.4 08/28/2016   PLT 247 08/28/2016     STUDIES: No results found.  ASSESSMENT: Pathologic stage Ia ER/PR positive, HER-2 negative invasive carcinoma of the left breast, unspecified location.  PLAN:    1. Pathologic stage Ia ER/PR positive, HER-2 negative invasive carcinoma of the left breast, unspecified location: Patient was initially diagnosed in December 2009 and status post lumpectomy and XRT. She completed 5 years of anti-hormonal therapy in October 2014. Her most recent mammogram on September 21, 2015 was reported as BI-RADS 2, repeat in January 2018. Return to clinic in 1 year for routine evaluation. 2. Renal insufficiency: Unclear patient's baseline, monitor.  Patient expressed understanding and was in agreement with this plan. She also understands that She can call clinic at any time with any questions, concerns, or complaints.   Primary cancer of left female breast [facility])    Staging form: Breast, AJCC 7th Edition   - Clinical stage from 08/26/2016: Stage IA (T1c, N0, M0) - Signed by Lloyd Huger, MD on 08/26/2016  Lloyd Huger, MD   08/31/2016 4:20 PM

## 2016-08-28 ENCOUNTER — Other Ambulatory Visit: Payer: Self-pay

## 2016-08-28 ENCOUNTER — Inpatient Hospital Stay (HOSPITAL_BASED_OUTPATIENT_CLINIC_OR_DEPARTMENT_OTHER): Payer: Medicare Other | Admitting: Oncology

## 2016-08-28 ENCOUNTER — Ambulatory Visit: Payer: Medicare Other | Admitting: Oncology

## 2016-08-28 ENCOUNTER — Inpatient Hospital Stay: Payer: Medicare Other | Attending: Oncology

## 2016-08-28 VITALS — BP 135/77 | HR 121 | Temp 98.4°F | Resp 18 | Wt 138.9 lb

## 2016-08-28 DIAGNOSIS — Z170 Estrogen receptor positive status [ER+]: Secondary | ICD-10-CM | POA: Insufficient documentation

## 2016-08-28 DIAGNOSIS — E079 Disorder of thyroid, unspecified: Secondary | ICD-10-CM

## 2016-08-28 DIAGNOSIS — I10 Essential (primary) hypertension: Secondary | ICD-10-CM | POA: Diagnosis not present

## 2016-08-28 DIAGNOSIS — N2889 Other specified disorders of kidney and ureter: Secondary | ICD-10-CM | POA: Diagnosis not present

## 2016-08-28 DIAGNOSIS — E7800 Pure hypercholesterolemia, unspecified: Secondary | ICD-10-CM | POA: Insufficient documentation

## 2016-08-28 DIAGNOSIS — Z853 Personal history of malignant neoplasm of breast: Secondary | ICD-10-CM | POA: Insufficient documentation

## 2016-08-28 DIAGNOSIS — Z79899 Other long term (current) drug therapy: Secondary | ICD-10-CM | POA: Diagnosis not present

## 2016-08-28 DIAGNOSIS — C50912 Malignant neoplasm of unspecified site of left female breast: Secondary | ICD-10-CM

## 2016-08-28 DIAGNOSIS — C50919 Malignant neoplasm of unspecified site of unspecified female breast: Secondary | ICD-10-CM

## 2016-08-28 LAB — COMPREHENSIVE METABOLIC PANEL
ALK PHOS: 104 U/L (ref 38–126)
ALT: 36 U/L (ref 14–54)
AST: 40 U/L (ref 15–41)
Albumin: 4.4 g/dL (ref 3.5–5.0)
Anion gap: 9 (ref 5–15)
BILIRUBIN TOTAL: 0.8 mg/dL (ref 0.3–1.2)
BUN: 37 mg/dL — ABNORMAL HIGH (ref 6–20)
CALCIUM: 10.1 mg/dL (ref 8.9–10.3)
CO2: 24 mmol/L (ref 22–32)
CREATININE: 1.55 mg/dL — AB (ref 0.44–1.00)
Chloride: 105 mmol/L (ref 101–111)
GFR, EST AFRICAN AMERICAN: 33 mL/min — AB (ref >60)
GFR, EST NON AFRICAN AMERICAN: 29 mL/min — AB (ref >60)
Glucose, Bld: 103 mg/dL — ABNORMAL HIGH (ref 65–99)
Potassium: 4.4 mmol/L (ref 3.5–5.1)
Sodium: 138 mmol/L (ref 135–145)
TOTAL PROTEIN: 7.5 g/dL (ref 6.5–8.1)

## 2016-08-28 LAB — CBC WITH DIFFERENTIAL/PLATELET
Basophils Absolute: 0.1 10*3/uL (ref 0–0.1)
Basophils Relative: 1 %
Eosinophils Absolute: 0.8 10*3/uL — ABNORMAL HIGH (ref 0–0.7)
Eosinophils Relative: 8 %
HCT: 40.1 % (ref 35.0–47.0)
HEMOGLOBIN: 13.3 g/dL (ref 12.0–16.0)
LYMPHS ABS: 2.4 10*3/uL (ref 1.0–3.6)
LYMPHS PCT: 25 %
MCH: 30.3 pg (ref 26.0–34.0)
MCHC: 33.2 g/dL (ref 32.0–36.0)
MCV: 91.4 fL (ref 80.0–100.0)
MONOS PCT: 9 %
Monocytes Absolute: 0.8 10*3/uL (ref 0.2–0.9)
NEUTROS PCT: 57 %
Neutro Abs: 5.6 10*3/uL (ref 1.4–6.5)
Platelets: 247 10*3/uL (ref 150–440)
RBC: 4.39 MIL/uL (ref 3.80–5.20)
RDW: 15.6 % — ABNORMAL HIGH (ref 11.5–14.5)
WBC: 9.6 10*3/uL (ref 3.6–11.0)

## 2016-08-28 NOTE — Progress Notes (Signed)
Offers no complaints. Hard of hearing. Feeling well.

## 2016-09-26 ENCOUNTER — Ambulatory Visit
Admission: EM | Admit: 2016-09-26 | Discharge: 2016-09-26 | Disposition: A | Discharge status: Home / Self Care | Payer: Medicare Other | Attending: Family Medicine | Admitting: Family Medicine

## 2016-09-26 ENCOUNTER — Encounter: Payer: Self-pay | Admitting: *Deleted

## 2016-09-26 DIAGNOSIS — J069 Acute upper respiratory infection, unspecified: Secondary | ICD-10-CM

## 2016-09-26 DIAGNOSIS — R05 Cough: Secondary | ICD-10-CM

## 2016-09-26 DIAGNOSIS — B9789 Other viral agents as the cause of diseases classified elsewhere: Secondary | ICD-10-CM

## 2016-09-26 DIAGNOSIS — R059 Cough, unspecified: Secondary | ICD-10-CM

## 2016-09-26 DIAGNOSIS — J309 Allergic rhinitis, unspecified: Secondary | ICD-10-CM | POA: Diagnosis not present

## 2016-09-26 NOTE — ED Triage Notes (Signed)
Patient started having symptoms of cough and chest congestion 5 days ago. Patient's husband was just released from the hospital for pneumonia.

## 2016-09-26 NOTE — ED Provider Notes (Signed)
CSN: [id number]     Arrival date & time 09/26/16  1542 History   None    Chief Complaint  Patient presents with  . URI   (Consider location/radiation/quality/duration/timing/severity/associated sxs/prior Treatment) HPI: 83 y/o female presented with daughter in Law with CC of non productive cough, nasal congestion x 5 days. Denies fever/chills. Denies SOB/palpitations. A&Ox3. Comfortably communicating. No signs of acute visible distress.  Past Medical History:  Diagnosis Date  . Breast cancer ([place]) 09/15/2008   lumpectomy with Radiation  . Cancer [facility])    Breast Cancer  . High cholesterol   . Hypertension   . Renal disorder   . Thyroid disease    Past Surgical History:  Procedure Laterality Date  . BREAST BIOPSY Left 2009   stereo Bx   +  . BREAST LUMPECTOMY Left 2009   with radiation   Family History  Problem Relation Age of Onset  . Breast cancer Neg Hx    Social History  Substance Use Topics  . Smoking status: Never Smoker  . Smokeless tobacco: Never Used  . Alcohol use No   OB History    No data available     Review of Systems  Respiratory: Positive for cough. Negative for chest tightness, shortness of breath and wheezing.     Allergies  Cephalexin; Ciprofloxacin; Hydrocodone-chlorpheniramine; Metronidazole; Sulfa antibiotics; Sulfamethoxazole-trimethoprim; Tetracyclines & related; and Tussionex pennkinetic er [hydrocod polst-cpm polst er]  Home Medications   Prior to Admission medications   Medication Sig Start Date End Date Taking? Authorizing Provider  allopurinol (ZYLOPRIM) 100 MG tablet Take 100 mg by mouth daily. 11/15/15  Yes Historical Provider, MD  amLODipine (NORVASC) 5 MG tablet Take 5 mg by mouth daily.  03/24/15  Yes Historical Provider, MD  atorvastatin (LIPITOR) 20 MG tablet Take 20 mg by mouth daily at 6 PM.   Yes Historical Provider, MD  enalapril (VASOTEC) 20 MG tablet Take 20 mg by mouth 2 (two) times daily.  04/13/15  Yes Historical Provider,  MD  erythromycin ophthalmic ointment Place 1 application into the left eye 4 (four) times daily. For 1 week 05/20/15  Yes Lisa Roca, MD  levothyroxine (SYNTHROID, LEVOTHROID) 112 MCG tablet Take 112 mcg by mouth daily before breakfast.   Yes Historical Provider, MD  omeprazole (PRILOSEC OTC) 20 MG tablet Take 20 mg by mouth daily as needed. For heartburn/indigestion.   Yes Historical Provider, MD  acetaminophen (TYLENOL) 500 MG tablet Take 500 mg by mouth every 6 (six) hours as needed for mild pain, moderate pain or fever.     Historical Provider, MD  levothyroxine (SYNTHROID, LEVOTHROID) 100 MCG tablet Take 100 mcg by mouth daily before breakfast. *Take 30 to 60 minutes before breakfast*.    Historical Provider, MD  lovastatin (MEVACOR) 40 MG tablet Take 40 mg by mouth at bedtime. 10/25/15   Historical Provider, MD   Meds Ordered and Administered this Visit  Medications - No data to display  BP 140/78 (BP Location: Left Arm)   Pulse (!) 114   Temp 97.8 F (36.6 C)   Resp 16   Ht 5\' 3"  (1.6 m)   Wt 136 lb (61.7 kg)   SpO2 97%   BMI 24.09 kg/m  No data found.   Physical Exam  Pulmonary/Chest: Effort normal and breath sounds normal. No respiratory distress. She has no wheezes. She has no rales. She exhibits no tenderness.  Abdominal: Soft. Bowel sounds are normal.  B/L nasal mucosal inflammation with post nasal drip. Lungs CTA.  No wheezing or airflow limitation appreciated.  Urgent Care Course   Clinical Course     Procedures (including critical care time)  Labs Review Labs Reviewed - No data to display  Imaging Review No results found.   Visual Acuity Review  Right Eye Distance:   Left Eye Distance:   Bilateral Distance:    Right Eye Near:   Left Eye Near:    Bilateral Near:         MDM   1. Viral upper respiratory tract infection   2. Acute allergic rhinitis, unspecified seasonality, unspecified trigger   3. Cough   Sx consistent with Viral URI with  allergic rhinitis. OTC Mucinex, Zyrtec and Flonase as directed and discussed with patient and daughter in law.  Concerns for recent +ve Pneumonia for husband who is in Rehab. Educated on warning signs: Productive cough, fever., SOB. If any noted advised to see PCP or return to clinic for re evaluation.   Tyarra Nolton, NP 09/26/16 1816

## 2016-09-28 ENCOUNTER — Encounter (INDEPENDENT_AMBULATORY_CARE_PROVIDER_SITE_OTHER): Payer: Self-pay

## 2016-10-04 ENCOUNTER — Ambulatory Visit: Payer: Medicare Other

## 2016-11-01 ENCOUNTER — Emergency Department
Admission: EM | Admit: 2016-11-01 | Discharge: 2016-11-01 | Disposition: A | Discharge status: Home / Self Care | Payer: Medicare Other | Attending: Emergency Medicine | Admitting: Emergency Medicine

## 2016-11-01 ENCOUNTER — Emergency Department: Payer: Medicare Other

## 2016-11-01 DIAGNOSIS — R197 Diarrhea, unspecified: Secondary | ICD-10-CM | POA: Insufficient documentation

## 2016-11-01 DIAGNOSIS — Z79899 Other long term (current) drug therapy: Secondary | ICD-10-CM | POA: Insufficient documentation

## 2016-11-01 DIAGNOSIS — I10 Essential (primary) hypertension: Secondary | ICD-10-CM | POA: Diagnosis not present

## 2016-11-01 DIAGNOSIS — R112 Nausea with vomiting, unspecified: Secondary | ICD-10-CM | POA: Diagnosis present

## 2016-11-01 DIAGNOSIS — Z853 Personal history of malignant neoplasm of breast: Secondary | ICD-10-CM | POA: Insufficient documentation

## 2016-11-01 LAB — COMPREHENSIVE METABOLIC PANEL
ALT: 30 U/L (ref 14–54)
ANION GAP: 7 (ref 5–15)
AST: 36 U/L (ref 15–41)
Albumin: 3.9 g/dL (ref 3.5–5.0)
Alkaline Phosphatase: 90 U/L (ref 38–126)
BUN: 39 mg/dL — AB (ref 6–20)
CHLORIDE: 109 mmol/L (ref 101–111)
CO2: 24 mmol/L (ref 22–32)
Calcium: 9.8 mg/dL (ref 8.9–10.3)
Creatinine, Ser: 1.68 mg/dL — ABNORMAL HIGH (ref 0.44–1.00)
GFR calc non Af Amer: 26 mL/min — ABNORMAL LOW (ref >60)
GFR, EST AFRICAN AMERICAN: 30 mL/min — AB (ref >60)
Glucose, Bld: 176 mg/dL — ABNORMAL HIGH (ref 65–99)
POTASSIUM: 3.8 mmol/L (ref 3.5–5.1)
Sodium: 140 mmol/L (ref 135–145)
TOTAL PROTEIN: 7 g/dL (ref 6.5–8.1)
Total Bilirubin: 0.3 mg/dL (ref 0.3–1.2)

## 2016-11-01 LAB — CBC WITH DIFFERENTIAL/PLATELET
Basophils Absolute: 0.1 10*3/uL (ref 0–0.1)
Basophils Relative: 1 %
EOS PCT: 2 %
Eosinophils Absolute: 0.1 10*3/uL (ref 0–0.7)
HCT: 34.6 % — ABNORMAL LOW (ref 35.0–47.0)
HEMOGLOBIN: 11.5 g/dL — AB (ref 12.0–16.0)
LYMPHS ABS: 0.9 10*3/uL — AB (ref 1.0–3.6)
LYMPHS PCT: 11 %
MCH: 30.5 pg (ref 26.0–34.0)
MCHC: 33.2 g/dL (ref 32.0–36.0)
MCV: 91.9 fL (ref 80.0–100.0)
MONOS PCT: 8 %
Monocytes Absolute: 0.6 10*3/uL (ref 0.2–0.9)
NEUTROS PCT: 78 %
Neutro Abs: 6.4 10*3/uL (ref 1.4–6.5)
Platelets: 168 10*3/uL (ref 150–440)
RBC: 3.77 MIL/uL — AB (ref 3.80–5.20)
RDW: 15.1 % — ABNORMAL HIGH (ref 11.5–14.5)
WBC: 8.2 10*3/uL (ref 3.6–11.0)

## 2016-11-01 LAB — TROPONIN I
Troponin I: 0.03 ng/mL (ref <0.03)
Troponin I: 0.03 ng/mL (ref <0.03)

## 2016-11-01 LAB — URINALYSIS, COMPLETE (UACMP) WITH MICROSCOPIC
BILIRUBIN URINE: NEGATIVE
GLUCOSE, UA: NEGATIVE mg/dL
Hgb urine dipstick: NEGATIVE
KETONES UR: NEGATIVE mg/dL
Leukocytes, UA: NEGATIVE
NITRITE: NEGATIVE
PH: 6 (ref 5.0–8.0)
Protein, ur: NEGATIVE mg/dL
SPECIFIC GRAVITY, URINE: 1.011 (ref 1.005–1.030)

## 2016-11-01 LAB — LIPASE, BLOOD: Lipase: 47 U/L (ref 11–51)

## 2016-11-01 MED ORDER — SODIUM CHLORIDE 0.9 % IV BOLUS (SEPSIS)
500.0000 mL | Freq: Once | INTRAVENOUS | Status: AC
  Administered 2016-11-01: 500 mL via INTRAVENOUS

## 2016-11-01 MED ORDER — SODIUM CHLORIDE 0.9 % IV BOLUS (SEPSIS)
1000.0000 mL | Freq: Once | INTRAVENOUS | Status: AC
  Administered 2016-11-01: 1000 mL via INTRAVENOUS

## 2016-11-01 MED ORDER — ONDANSETRON 4 MG PO TBDP: 4.0000 mg | ORAL_TABLET | Freq: Three times a day (TID) | ORAL | 0 refills | Status: DC | PRN

## 2016-11-01 NOTE — ED Triage Notes (Signed)
Pt arrives to ED from home via [facility] with c/o N/V and "explosive diarrhea" that started 1 hr PTA. EMS reports giving 4 mf IV Zofran and did not observe any N/V while in route. Pt is A&O, in NAD, with respirations even, regular, and unlabored. Of note, EMS reports pt under significant stress over the last few days (had 1 family member pass away on 12/28/22, and her husband passed away the next day on 2022-12-29).

## 2016-11-01 NOTE — ED Provider Notes (Signed)
[facility] Emergency Department Provider Note   ____________________________________________   First MD Initiated Contact with Patient 11/01/16 0023     (approximate)  I have reviewed the triage vital signs and the nursing notes.   HISTORY  Chief Complaint Emesis and Diarrhea    HPI Samantha Bowen is a 83 y.o. female brought to the ED from home via EMS with a chief complaint of nausea/vomiting/"explosive diarrhea". 7 onset of symptoms approximately one hour prior. Patient received 4 mg IV Zofran en route.She has been in and out of the hospital as well as nursing home with her sick spouse who died recently; funeral is today. Denies recent fever, chills, chest pain, shortness of breath, dysuria. Denies recent travel or trauma. Zofran given en route starting to help her nausea.   Past Medical History:  Diagnosis Date  . Breast cancer ([place]) 09/15/2008   lumpectomy with Radiation  . Cancer [facility])    Breast Cancer  . High cholesterol   . Hypertension   . Renal disorder   . Thyroid disease     Patient Active Problem List   Diagnosis Date Noted  . Primary cancer of left female breast ([place]) 08/26/2016  . Pneumonia 01/15/2016    Past Surgical History:  Procedure Laterality Date  . BREAST BIOPSY Left 2009   stereo Bx   +  . BREAST LUMPECTOMY Left 2009   with radiation    Prior to Admission medications   Medication Sig Start Date End Date Taking? Authorizing Provider  acetaminophen (TYLENOL) 500 MG tablet Take 500 mg by mouth every 6 (six) hours as needed for mild pain, moderate pain or fever.     Historical Provider, MD  allopurinol (ZYLOPRIM) 100 MG tablet Take 100 mg by mouth daily. 11/15/15   Historical Provider, MD  amLODipine (NORVASC) 5 MG tablet Take 5 mg by mouth daily.  03/24/15   Historical Provider, MD  atorvastatin (LIPITOR) 20 MG tablet Take 20 mg by mouth daily at 6 PM.    Historical Provider, MD  enalapril (VASOTEC) 20 MG tablet Take  20 mg by mouth 2 (two) times daily.  04/13/15   Historical Provider, MD  erythromycin ophthalmic ointment Place 1 application into the left eye 4 (four) times daily. For 1 week 05/20/15   Lisa Roca, MD  levothyroxine (SYNTHROID, LEVOTHROID) 100 MCG tablet Take 100 mcg by mouth daily before breakfast. *Take 30 to 60 minutes before breakfast*.    Historical Provider, MD  levothyroxine (SYNTHROID, LEVOTHROID) 112 MCG tablet Take 112 mcg by mouth daily before breakfast.    Historical Provider, MD  lovastatin (MEVACOR) 40 MG tablet Take 40 mg by mouth at bedtime. 10/25/15   Historical Provider, MD  omeprazole (PRILOSEC OTC) 20 MG tablet Take 20 mg by mouth daily as needed. For heartburn/indigestion.    Historical Provider, MD  ondansetron (ZOFRAN ODT) 4 MG disintegrating tablet Take 1 tablet (4 mg total) by mouth every 8 (eight) hours as needed for nausea or vomiting. 11/01/16   Paulette Blanch, MD    Allergies Cephalexin; Ciprofloxacin; Hydrocodone-chlorpheniramine; Metronidazole; Sulfa antibiotics; Sulfamethoxazole-trimethoprim; Tetracyclines & related; and Tussionex pennkinetic er [hydrocod polst-cpm polst er]  Family History  Problem Relation Age of Onset  . Breast cancer Neg Hx     Social History Social History  Substance Use Topics  . Smoking status: Never Smoker  . Smokeless tobacco: Never Used  . Alcohol use No    Review of Systems  Constitutional: No fever/chills. Eyes: No  visual changes. ENT: No sore throat. Cardiovascular: Denies chest pain. Respiratory: Denies shortness of breath. Gastrointestinal: No abdominal pain.  Positive for nausea, vomiting and diarrhea.  No constipation. Genitourinary: Negative for dysuria. Musculoskeletal: Negative for back pain. Skin: Negative for rash. Neurological: Negative for headaches, focal weakness or numbness.  10-point ROS otherwise negative.  ____________________________________________   PHYSICAL EXAM:  VITAL SIGNS: ED Triage Vitals    Enc Vitals Group     BP 11/01/16 0018 126/63     Pulse Rate 11/01/16 0018 93     Resp 11/01/16 0018 16     Temp 11/01/16 0018 97.5 F (36.4 C)     Temp Source 11/01/16 0018 Oral     SpO2 11/01/16 0018 93 %     Weight 11/01/16 0019 140 lb (63.5 kg)     Height 11/01/16 0019 5\' 4"  (1.626 m)     Head Circumference --      Peak Flow --      Pain Score 11/01/16 0020 3     Pain Loc --      Pain Edu? --      Excl. in [place]? --     Constitutional: Alert and oriented. Frail appearing and in mild acute distress. Eyes: Conjunctivae are normal. PERRL. EOMI. Head: Atraumatic. Nose: No congestion/rhinnorhea. Mouth/Throat: Mucous membranes are mildly dry.  Oropharynx non-erythematous. Neck: No stridor.   Cardiovascular: Normal rate, regular rhythm. Grossly normal heart sounds.  Good peripheral circulation. Respiratory: Normal respiratory effort.  No retractions. Lungs CTAB. Gastrointestinal: Soft and nontender to light and deep palpation. No distention. No abdominal bruits. No CVA tenderness. Musculoskeletal: No lower extremity tenderness nor edema.  No joint effusions. Neurologic:  Normal speech and language. No gross focal neurologic deficits are appreciated.  Skin:  Skin is warm, dry and intact. No rash noted. Psychiatric: Mood and affect are normal. Speech and behavior are normal.  ____________________________________________   LABS (all labs ordered are listed, but only abnormal results are displayed)  Labs Reviewed  COMPREHENSIVE METABOLIC PANEL - Abnormal; Notable for the following:       Result Value   Glucose, Bld 176 (*)    BUN 39 (*)    Creatinine, Ser 1.68 (*)    GFR calc non Af Amer 26 (*)    GFR calc Af Amer 30 (*)    All other components within normal limits  URINALYSIS, COMPLETE (UACMP) WITH MICROSCOPIC - Abnormal; Notable for the following:    Color, Urine STRAW (*)    APPearance CLEAR (*)    Bacteria, UA RARE (*)    Squamous Epithelial / LPF 0-5 (*)    All other  components within normal limits  TROPONIN I - Abnormal; Notable for the following:    Troponin I 0.03 (*)    All other components within normal limits  CBC WITH DIFFERENTIAL/PLATELET - Abnormal; Notable for the following:    RBC 3.77 (*)    Hemoglobin 11.5 (*)    HCT 34.6 (*)    RDW 15.1 (*)    Lymphs Abs 0.9 (*)    All other components within normal limits  TROPONIN I - Abnormal; Notable for the following:    Troponin I 0.03 (*)    All other components within normal limits  LIPASE, BLOOD   ____________________________________________  EKG  ED ECG REPORT I, Emory Leaver J, the attending physician, personally viewed and interpreted this ECG.   Date: 11/01/2016  EKG Time: 0024  Rate: 93  Rhythm: normal EKG, normal sinus  rhythm  Axis: RAD  Intervals:none  ST&T Change: Nonspecific  ____________________________________________  RADIOLOGY  Portable chest x-ray interpreted per Dr. Gerilyn Nestle: Chronic interstitial pattern to the lungs without change. No  evidence of active pulmonary disease.   ____________________________________________   PROCEDURES  Procedure(s) performed: None  Procedures  Critical Care performed: No  ____________________________________________   INITIAL IMPRESSION / ASSESSMENT AND PLAN / ED COURSE  Pertinent labs & imaging results that were available during my care of the patient were reviewed by me and considered in my medical decision making (see chart for details).  83 year old female who presents with acute onset of nausea, vomiting and diarrhea. Zofran given by EMS improving patient's nausea. Will check screening lab work including troponin and reassess.  Clinical Course as of Nov 02 515  Thu Nov 01, 2016  0203 Patient sleeping in no acute distress. Updated family of laboratory results remarkable for worsening renal function from baseline which is likely secondary to dehydration. Also borderline troponin. Will infuse additional IV fluids,  repeat time troponin; awaiting urinalysis.  [JS]  [id number] Patient resting in no acute distress. Heart rate 60s. No further vomiting or diarrhea. Will trial ice chips in preparation for discharge. Repeat troponin remains the same; urinalysis is negative.  [JS]    Clinical Course User Index [JS] Paulette Blanch, MD     ____________________________________________   FINAL CLINICAL IMPRESSION(S) / ED DIAGNOSES  Final diagnoses:  Nausea vomiting and diarrhea      NEW MEDICATIONS STARTED DURING THIS VISIT:  New Prescriptions   ONDANSETRON (ZOFRAN ODT) 4 MG DISINTEGRATING TABLET    Take 1 tablet (4 mg total) by mouth every 8 (eight) hours as needed for nausea or vomiting.     Note:  This document was prepared using Dragon voice recognition software and may include unintentional dictation errors.    Paulette Blanch, MD 11/01/16 [phone]

## 2016-11-01 NOTE — Discharge Instructions (Signed)
1. You may take nausea medicine as needed (Zofran #20). 2. Clear liquids 12 hours, then BRAT diet 3 days, then slowly advance diet as tolerated. 3. Return to the ER for worsening symptoms, persistent vomiting, difficulty breathing or other concerns.

## 2017-04-11 ENCOUNTER — Emergency Department: Payer: Medicare Other

## 2017-04-11 ENCOUNTER — Emergency Department
Admission: EM | Admit: 2017-04-11 | Discharge: 2017-04-12 | Disposition: A | Discharge status: Home / Self Care | Payer: Medicare Other | Attending: Emergency Medicine | Admitting: Emergency Medicine

## 2017-04-11 DIAGNOSIS — W19XXXA Unspecified fall, initial encounter: Secondary | ICD-10-CM

## 2017-04-11 DIAGNOSIS — E079 Disorder of thyroid, unspecified: Secondary | ICD-10-CM | POA: Diagnosis not present

## 2017-04-11 DIAGNOSIS — Y929 Unspecified place or not applicable: Secondary | ICD-10-CM | POA: Diagnosis not present

## 2017-04-11 DIAGNOSIS — S3992XA Unspecified injury of lower back, initial encounter: Secondary | ICD-10-CM | POA: Diagnosis present

## 2017-04-11 DIAGNOSIS — Z79899 Other long term (current) drug therapy: Secondary | ICD-10-CM | POA: Diagnosis not present

## 2017-04-11 DIAGNOSIS — Y999 Unspecified external cause status: Secondary | ICD-10-CM | POA: Diagnosis not present

## 2017-04-11 DIAGNOSIS — Y939 Activity, unspecified: Secondary | ICD-10-CM | POA: Diagnosis not present

## 2017-04-11 DIAGNOSIS — I10 Essential (primary) hypertension: Secondary | ICD-10-CM | POA: Insufficient documentation

## 2017-04-11 DIAGNOSIS — W010XXA Fall on same level from slipping, tripping and stumbling without subsequent striking against object, initial encounter: Secondary | ICD-10-CM | POA: Insufficient documentation

## 2017-04-11 DIAGNOSIS — S300XXA Contusion of lower back and pelvis, initial encounter: Secondary | ICD-10-CM

## 2017-04-11 LAB — CBC
HEMATOCRIT: 38.5 % (ref 35.0–47.0)
Hemoglobin: 13 g/dL (ref 12.0–16.0)
MCH: 31.4 pg (ref 26.0–34.0)
MCHC: 33.8 g/dL (ref 32.0–36.0)
MCV: 93.1 fL (ref 80.0–100.0)
Platelets: 189 10*3/uL (ref 150–440)
RBC: 4.13 MIL/uL (ref 3.80–5.20)
RDW: 15.2 % — AB (ref 11.5–14.5)
WBC: 9.9 10*3/uL (ref 3.6–11.0)

## 2017-04-11 LAB — BASIC METABOLIC PANEL
ANION GAP: 10 (ref 5–15)
BUN: 30 mg/dL — ABNORMAL HIGH (ref 6–20)
CALCIUM: 10.4 mg/dL — AB (ref 8.9–10.3)
CO2: 25 mmol/L (ref 22–32)
Chloride: 103 mmol/L (ref 101–111)
Creatinine, Ser: 1.64 mg/dL — ABNORMAL HIGH (ref 0.44–1.00)
GFR, EST AFRICAN AMERICAN: 31 mL/min — AB (ref >60)
GFR, EST NON AFRICAN AMERICAN: 27 mL/min — AB (ref >60)
Glucose, Bld: 135 mg/dL — ABNORMAL HIGH (ref 65–99)
Potassium: 4.5 mmol/L (ref 3.5–5.1)
SODIUM: 138 mmol/L (ref 135–145)

## 2017-04-11 LAB — TROPONIN I: Troponin I: <0.03 ng/mL (ref <0.03)

## 2017-04-11 MED ORDER — SODIUM CHLORIDE 0.9 % IV BOLUS (SEPSIS)
500.0000 mL | Freq: Once | INTRAVENOUS | Status: AC
  Administered 2017-04-11: 500 mL via INTRAVENOUS

## 2017-04-11 MED ORDER — ACETAMINOPHEN 500 MG PO TABS
1000.0000 mg | ORAL_TABLET | ORAL | Status: AC
  Administered 2017-04-11: 1000 mg via ORAL
  Filled 2017-04-11: qty 2

## 2017-04-11 NOTE — ED Provider Notes (Signed)
[facility] Emergency Department Provider Note   ____________________________________________   First MD Initiated Contact with Patient 04/11/17 2156     (approximate)  I have reviewed the triage vital signs and the nursing notes.   HISTORY  Chief Complaint Fall    HPI Samantha Bowen is a 83 y.o. female evaluated for fall  Patient reports she tripped while turning on a rug. She reports that she is having tenderness across her right lower ribs and along her right lower back. Denies headache. No neck pain. No other injury. Denies any weakness, chest pain or trouble breathing. Does report tenderness along the right lower ribs however. Denies pain in the hips or feet  Daughter's report patient takes no blood thinners. She does fall occasionally. She has some memory issues, this fall was not witnessed. Patient has been in her normal health recently. Her husband died in 11/18/2022. Daughter has been living with her in the evenings.  Past Medical History:  Diagnosis Date  . Breast cancer ([place]) 09/15/2008   lumpectomy with Radiation  . Cancer [facility])    Breast Cancer  . High cholesterol   . Hypertension   . Renal disorder   . Thyroid disease     Patient Active Problem List   Diagnosis Date Noted  . Primary cancer of left female breast ([place]) 08/26/2016  . Pneumonia 01/15/2016    Past Surgical History:  Procedure Laterality Date  . BREAST BIOPSY Left 2009   stereo Bx   +  . BREAST LUMPECTOMY Left 2009   with radiation    Prior to Admission medications   Medication Sig Start Date End Date Taking? Authorizing Provider  acetaminophen (TYLENOL) 500 MG tablet Take 500 mg by mouth every 6 (six) hours as needed for mild pain, moderate pain or fever.     [provider]  allopurinol (ZYLOPRIM) 100 MG tablet Take 100 mg by mouth daily. 11/15/15   [provider]  amLODipine (NORVASC) 5 MG tablet Take 5 mg by mouth daily.  03/24/15   [provider]  atorvastatin (LIPITOR) 20 MG tablet Take 20 mg by mouth daily at 6 PM.    [provider]  enalapril (VASOTEC) 20 MG tablet Take 20 mg by mouth 2 (two) times daily.  04/13/15   [provider]  erythromycin ophthalmic ointment Place 1 application into the left eye 4 (four) times daily. For 1 week 05/20/15   Lisa Roca, MD  levothyroxine (SYNTHROID, LEVOTHROID) 100 MCG tablet Take 100 mcg by mouth daily before breakfast. *Take 30 to 60 minutes before breakfast*.    [provider]  levothyroxine (SYNTHROID, LEVOTHROID) 112 MCG tablet Take 112 mcg by mouth daily before breakfast.    [provider]  lovastatin (MEVACOR) 40 MG tablet Take 40 mg by mouth at bedtime. 10/25/15   [provider]  omeprazole (PRILOSEC OTC) 20 MG tablet Take 20 mg by mouth daily as needed. For heartburn/indigestion.    [provider]  ondansetron (ZOFRAN ODT) 4 MG disintegrating tablet Take 1 tablet (4 mg total) by mouth every 8 (eight) hours as needed for nausea or vomiting. 11/01/16   Paulette Blanch, MD    Allergies Cephalexin; Ciprofloxacin; Hydrocodone-chlorpheniramine; Metronidazole; Sulfa antibiotics; Sulfamethoxazole-trimethoprim; Tetracyclines & related; and Tussionex pennkinetic er [hydrocod polst-cpm polst er]  Family History  Problem Relation Age of Onset  . Breast cancer Neg Hx     Social History Social History  Substance Use Topics  . Smoking  status: Never Smoker  . Smokeless tobacco: Never Used  . Alcohol use No    Review of Systems Constitutional: No fever/chills Eyes: No visual changes. ENT: No sore throat. Cardiovascular: Denies chest pain.Reports some tenderness along the back right ribs Respiratory: Denies shortness of breath. Gastrointestinal: No abdominal pain.  No nausea, no vomiting.  No diarrhea.  No constipation. Genitourinary: Negative for dysuria. Musculoskeletal: See history of present illness. Denies pain in the  back but does point and shows she is tender along the right posterior pelvic brim. Skin: Negative for rash. Neurological: Negative for headaches, focal weakness or numbness.    ____________________________________________   PHYSICAL EXAM:  VITAL SIGNS: ED Triage Vitals  Enc Vitals Group     BP 04/11/17 2137 (!) 157/81     Pulse Rate 04/11/17 2137 (!) 125     Resp 04/11/17 2137 20     Temp 04/11/17 2137 97.9 F (36.6 C)     Temp Source 04/11/17 2137 Oral     SpO2 04/11/17 2137 97 %     Weight --      Height --      Head Circumference --      Peak Flow --      Pain Score 04/11/17 2136 8     Pain Loc --      Pain Edu? --      Excl. in [place]? --     Constitutional: Alert and oriented. Well appearing and in no acute distress. Eyes: Conjunctivae are normal. Head: Atraumatic. Nose: No congestion/rhinnorhea. Mouth/Throat: Mucous membranes are moist. Neck: No stridor.  No cervical spine tenderness. Cardiovascular: Normal rate, regular rhythm. Grossly normal heart sounds.  Good peripheral circulation. Respiratory: Normal respiratory effort.  No retractions. Lungs CTAB. Minimal tenderness across the right posterior rib cage without deformity or bruising. Denies pain with deep inspiration. Gastrointestinal: Soft and nontender. No distention. Musculoskeletal: No lower extremity tenderness nor edema. No midline thoracic or lumbar tenderness. Mild tenderness along the right posterior pelvic brim without bruising. Full range of motion of the hips bilaterally without pain  Lower Extremities  No edema. Normal DP/PT pulses bilateral with good cap refill.  Normal neuro-motor function lower extremities bilateral.  RIGHT Right lower extremity demonstrates normal strength, good use of all muscles. No edema bruising or contusions of the right hip, right knee, right ankle. Full range of motion of the right lower extremity without pain. No pain on axial loading. No evidence of  trauma.  LEFT Left lower extremity demonstrates normal strength, good use of all muscles. No edema bruising or contusions of the hip,  knee, ankle. Full range of motion of the left lower extremity without pain. No pain on axial loading. No evidence of trauma.  RIGHT Right upper extremity demonstrates normal strength, good use of all muscles. No edema bruising or contusions of the right shoulder/upper arm, right elbow, right forearm / hand. Full range of motion of the right right upper extremity without pain. No evidence of trauma. Strong radial pulse. Intact median/ulnar/radial neuro-muscular exam.  LEFT Left upper extremity demonstrates normal strength, good use of all muscles. No edema bruising or contusions of the left shoulder/upper arm, left elbow, left forearm / hand. Full range of motion of the left  upper extremity without pain. No evidence of trauma. Strong radial pulse. Intact median/ulnar/radial neuro-muscular exam.  Neurologic:  Normal speech and language. No gross focal neurologic deficits are appreciated.  Skin:  Skin is warm, dry and intact. No rash noted. Psychiatric: Mood  and affect are normal. Speech and behavior are normal.  ____________________________________________   LABS (all labs ordered are listed, but only abnormal results are displayed)  Labs Reviewed  CBC - Abnormal; Notable for the following:       Result Value   RDW 15.2 (*)    All other components within normal limits  BASIC METABOLIC PANEL - Abnormal; Notable for the following:    Glucose, Bld 135 (*)    BUN 30 (*)    Creatinine, Ser 1.64 (*)    Calcium 10.4 (*)    GFR calc non Af Amer 27 (*)    GFR calc Af Amer 31 (*)    All other components within normal limits  TROPONIN I   ____________________________________________  EKG  Reviewed and interpreted by me at 2140 Heart rate 120 QRS 90 QTc 440 Sinus tachycardia without ischemic changes  noted ____________________________________________  RADIOLOGY  Dg Chest 1 View  Result Date: 04/11/2017 CLINICAL DATA:  Fall with right rib pain EXAM: CHEST 1 VIEW COMPARISON:  11/01/2016 FINDINGS: No acute infiltrate or effusion. Stable cardiomediastinal silhouette with atherosclerosis. No pneumothorax. IMPRESSION: No active disease. Electronically Signed   By: Donavan Foil M.D.   On: 04/11/2017 22:10   Ct Head Wo Contrast  Result Date: 04/11/2017 CLINICAL DATA:  Recent fall EXAM: CT HEAD WITHOUT CONTRAST TECHNIQUE: Contiguous axial images were obtained from the base of the skull through the vertex without intravenous contrast. COMPARISON:  09/30/2013 FINDINGS: Brain: Atrophic changes and chronic white matter ischemic change. No findings to suggest acute hemorrhage, acute infarction or space-occupying mass lesion are seen. Vascular: No hyperdense vessel or unexpected calcification. Skull: Normal. Negative for fracture or focal lesion. Sinuses/Orbits: No acute finding. Other: None. IMPRESSION: Atrophic and chronic white matter ischemic changes without acute abnormality. Electronically Signed   By: Inez Catalina M.D.   On: 04/11/2017 22:53   Dg Hip Unilat W Or Wo Pelvis 2-3 Views Right  Result Date: 04/11/2017 CLINICAL DATA:  Unwitnessed fall.  Right hip pain. EXAM: DG HIP (WITH OR WITHOUT PELVIS) 2-3V RIGHT COMPARISON:  None. FINDINGS: There is no evidence of hip fracture or dislocation. There is no evidence of arthropathy or other focal bone abnormality. IMPRESSION: Negative. Electronically Signed   By: Andreas Newport M.D.   On: 04/11/2017 22:47    ____________________________________________   PROCEDURES  Procedure(s) performed: None  Procedures  Critical Care performed: No  ____________________________________________   INITIAL IMPRESSION / ASSESSMENT AND PLAN / ED COURSE  Pertinent labs & imaging results that were available during my care of the patient were reviewed by me  and considered in my medical decision making (see chart for details).  Fall. Likely mechanical in nature based on description. No obvious evidence of major injury. Hemodynamic stable with slight tachycardia noted. Family reports this is chronic elevation of her heart rate. No cardiac pulmonary or neurologic symptoms.    ----------------------------------------- 11:52 PM on 04/11/2017 -----------------------------------------  Resting comfortably in no distress. Minimal tachycardia with rate of 105. Discussed with patient's family and daughter, daughter reports patient has elevated heart rates at baseline and evaluated by doctors in the past. She has not had any recent systemic symptoms. Suspect she likely had a mechanical fall tripped on the edge of a rug. X-rays today and evaluation did not demonstrate any major injury, suspect likely contusion to the right posterior lumbar region. No evidence to support hip fracture or other skeletal injury. No bleeding. No evidence of head injury with negative CT.  Patient  reports at this time will feel slightly sore over the right lower back. No bruising or deformity. Discussed with family, comfortable taking home, over-the-counter Tylenol as needed for pain.  Return precautions and treatment recommendations and follow-up discussed with the patient's daughter (who lives with her in evenings and will be with her all night) who is agreeable with the plan.   ____________________________________________   FINAL CLINICAL IMPRESSION(S) / ED DIAGNOSES  Final diagnoses:  Fall, initial encounter  Lumbar contusion, initial encounter      NEW MEDICATIONS STARTED DURING THIS VISIT:  New Prescriptions   No medications on file     Note:  This document was prepared using Dragon voice recognition software and may include unintentional dictation errors.     Delman Kitten, MD 04/11/17 [phone]

## 2017-04-11 NOTE — ED Notes (Signed)
Yellow fall bracelet placed on pt.

## 2017-04-11 NOTE — ED Notes (Signed)
Pt taken to xray 

## 2017-04-11 NOTE — ED Triage Notes (Signed)
Pt arrives to ED via [facility] for a fall. Unknown time on floor. EMS states that med alert called them. EMS states pt believes she turned around and fell. Pt states she was off-balance. Pt denies other falls recently, does use a cane. States she fell on a rug, denies hitting head. C/o of R rib cage pain. Especially with movement or touch. EMS reports that family told them pt has "mild dementia". Pt answers questions appropriately.   EMS VS: HR 130. Afebrile. 162/92, 98% RA.

## 2017-04-11 NOTE — ED Notes (Signed)
Xray at bedside  Family at bedside

## 2017-04-11 NOTE — Discharge Instructions (Signed)

## 2017-08-10 ENCOUNTER — Emergency Department: Payer: Medicare Other

## 2017-08-10 ENCOUNTER — Other Ambulatory Visit: Payer: Self-pay

## 2017-08-10 ENCOUNTER — Emergency Department
Admission: EM | Admit: 2017-08-10 | Discharge: 2017-08-10 | Disposition: A | Discharge status: Home / Self Care | Payer: Medicare Other | Attending: Emergency Medicine | Admitting: Emergency Medicine

## 2017-08-10 DIAGNOSIS — M25551 Pain in right hip: Secondary | ICD-10-CM | POA: Insufficient documentation

## 2017-08-10 DIAGNOSIS — M25511 Pain in right shoulder: Secondary | ICD-10-CM | POA: Insufficient documentation

## 2017-08-10 DIAGNOSIS — W19XXXA Unspecified fall, initial encounter: Secondary | ICD-10-CM

## 2017-08-10 DIAGNOSIS — Z79899 Other long term (current) drug therapy: Secondary | ICD-10-CM | POA: Insufficient documentation

## 2017-08-10 DIAGNOSIS — W1839XA Other fall on same level, initial encounter: Secondary | ICD-10-CM | POA: Insufficient documentation

## 2017-08-10 DIAGNOSIS — S098XXA Other specified injuries of head, initial encounter: Secondary | ICD-10-CM | POA: Insufficient documentation

## 2017-08-10 DIAGNOSIS — Y999 Unspecified external cause status: Secondary | ICD-10-CM | POA: Insufficient documentation

## 2017-08-10 DIAGNOSIS — M25521 Pain in right elbow: Secondary | ICD-10-CM | POA: Diagnosis not present

## 2017-08-10 DIAGNOSIS — Y9389 Activity, other specified: Secondary | ICD-10-CM | POA: Diagnosis not present

## 2017-08-10 DIAGNOSIS — Y92121 Bathroom in nursing home as the place of occurrence of the external cause: Secondary | ICD-10-CM | POA: Insufficient documentation

## 2017-08-10 DIAGNOSIS — M545 Low back pain: Secondary | ICD-10-CM | POA: Insufficient documentation

## 2017-08-10 DIAGNOSIS — Z853 Personal history of malignant neoplasm of breast: Secondary | ICD-10-CM | POA: Insufficient documentation

## 2017-08-10 DIAGNOSIS — I10 Essential (primary) hypertension: Secondary | ICD-10-CM | POA: Diagnosis not present

## 2017-08-10 LAB — URINALYSIS, COMPLETE (UACMP) WITH MICROSCOPIC
Bilirubin Urine: NEGATIVE
Glucose, UA: NEGATIVE mg/dL
Ketones, ur: NEGATIVE mg/dL
Leukocytes, UA: NEGATIVE
Nitrite: NEGATIVE
Protein, ur: NEGATIVE mg/dL
Specific Gravity, Urine: 1.006 (ref 1.005–1.030)
pH: 6 (ref 5.0–8.0)

## 2017-08-10 LAB — CBC WITH DIFFERENTIAL/PLATELET
Basophils Absolute: 0 10*3/uL (ref 0–0.1)
Basophils Relative: 1 %
Eosinophils Absolute: 0.1 10*3/uL (ref 0–0.7)
Eosinophils Relative: 2 %
HCT: 39.2 % (ref 35.0–47.0)
Hemoglobin: 13.1 g/dL (ref 12.0–16.0)
Lymphocytes Relative: 11 %
Lymphs Abs: 1 10*3/uL (ref 1.0–3.6)
MCH: 30.8 pg (ref 26.0–34.0)
MCHC: 33.4 g/dL (ref 32.0–36.0)
MCV: 92.2 fL (ref 80.0–100.0)
Monocytes Absolute: 0.6 10*3/uL (ref 0.2–0.9)
Monocytes Relative: 7 %
Neutro Abs: 7.1 10*3/uL — ABNORMAL HIGH (ref 1.4–6.5)
Neutrophils Relative %: 79 %
Platelets: 190 10*3/uL (ref 150–440)
RBC: 4.25 MIL/uL (ref 3.80–5.20)
RDW: 14.7 % — ABNORMAL HIGH (ref 11.5–14.5)
WBC: 8.9 10*3/uL (ref 3.6–11.0)

## 2017-08-10 LAB — COMPREHENSIVE METABOLIC PANEL
ALK PHOS: 111 U/L (ref 38–126)
ALT: 35 U/L (ref 14–54)
ANION GAP: 6 (ref 5–15)
AST: 40 U/L (ref 15–41)
Albumin: 4.1 g/dL (ref 3.5–5.0)
BUN: 34 mg/dL — ABNORMAL HIGH (ref 6–20)
CALCIUM: 10.3 mg/dL (ref 8.9–10.3)
CO2: 27 mmol/L (ref 22–32)
CREATININE: 1.31 mg/dL — AB (ref 0.44–1.00)
Chloride: 105 mmol/L (ref 101–111)
GFR, EST AFRICAN AMERICAN: 41 mL/min — AB (ref >60)
GFR, EST NON AFRICAN AMERICAN: 35 mL/min — AB (ref >60)
Glucose, Bld: 101 mg/dL — ABNORMAL HIGH (ref 65–99)
Potassium: 4.2 mmol/L (ref 3.5–5.1)
SODIUM: 138 mmol/L (ref 135–145)
Total Bilirubin: 0.8 mg/dL (ref 0.3–1.2)
Total Protein: 7 g/dL (ref 6.5–8.1)

## 2017-08-10 MED ORDER — FOSFOMYCIN TROMETHAMINE 3 G PO PACK
3.0000 g | PACK | Freq: Once | ORAL | Status: AC
  Administered 2017-08-10: 3 g via ORAL
  Filled 2017-08-10: qty 3

## 2017-08-10 NOTE — ED Notes (Signed)
Assisted pt with bedpan usage.

## 2017-08-10 NOTE — ED Notes (Addendum)
Assisted pt on bedpan. Pt now resting comfortably. Given blanket and call bell.

## 2017-08-10 NOTE — ED Notes (Signed)
Patient transported to X-ray 

## 2017-08-10 NOTE — ED Notes (Signed)
Pt daughter here picking up pt

## 2017-08-10 NOTE — ED Triage Notes (Addendum)
Per EMS, pt from [facility] and reports she was walking to the BR this am and had mechanical fall. Pt has skin tear to right elbow and is reporting right shoulder pain as well as lower right side back pain and hip pain. No obvious deformity noted. Pt moving RUE and both lower extremities without difficulty at this time. EMS reports pt was tachycardic in the 120's during transport and pt has hx of HTN. Pt alert at this time. EMS states pt is not currently on blood thinners, denies head inj from fall or LOC per pt.

## 2017-08-10 NOTE — ED Notes (Signed)
EDP notified of of pt's HR 113 at this time, verbal order for blood specimen collection and UA. Pt on cardiac monitor at this time.

## 2017-08-10 NOTE — ED Provider Notes (Signed)
[facility] Emergency Department Provider Note  Time seen: 7:48 AM  I have reviewed the triage vital signs and the nursing notes.   HISTORY  Chief Complaint Fall    HPI Samantha Bowen is a 83 y.o. female with a past medical history of hypertension, hyperlipidemia, history of breast cancer, presents to the emergency department from her nursing facility after a fall.  According to the patient she was attempting to get to the bathroom with her walker and she tripped falling onto her right side.  Patient states she did hit her head but denies LOC.  Denies vomiting.  Patient denies any pain lying still in bed but states when she moves she has some pain in her right hip and right shoulder.  Does not believe she takes any blood thinners.  Past Medical History:  Diagnosis Date  . Breast cancer ([place]) 09/15/2008   lumpectomy with Radiation  . Cancer [facility])    Breast Cancer  . High cholesterol   . Hypertension   . Renal disorder   . Thyroid disease     Patient Active Problem List   Diagnosis Date Noted  . Primary cancer of left female breast ([place]) 08/26/2016  . Pneumonia 01/15/2016    Past Surgical History:  Procedure Laterality Date  . BREAST BIOPSY Left 2009   stereo Bx   +  . BREAST LUMPECTOMY Left 2009   with radiation    Prior to Admission medications   Medication Sig Start Date End Date Taking? Authorizing Provider  acetaminophen (TYLENOL) 500 MG tablet Take 500 mg by mouth every 6 (six) hours as needed for mild pain, moderate pain or fever.     [provider]  allopurinol (ZYLOPRIM) 100 MG tablet Take 100 mg by mouth daily. 11/15/15   [provider]  amLODipine (NORVASC) 5 MG tablet Take 5 mg by mouth daily.  03/24/15   [provider]  atorvastatin (LIPITOR) 20 MG tablet Take 20 mg by mouth daily at 6 PM.    [provider]  enalapril (VASOTEC) 20 MG tablet Take 20 mg by mouth 2 (two) times daily.  04/13/15    [provider]  erythromycin ophthalmic ointment Place 1 application into the left eye 4 (four) times daily. For 1 week 05/20/15   Lisa Roca, MD  levothyroxine (SYNTHROID, LEVOTHROID) 100 MCG tablet Take 100 mcg by mouth daily before breakfast. *Take 30 to 60 minutes before breakfast*.    [provider]  levothyroxine (SYNTHROID, LEVOTHROID) 112 MCG tablet Take 112 mcg by mouth daily before breakfast.    [provider]  lovastatin (MEVACOR) 40 MG tablet Take 40 mg by mouth at bedtime. 10/25/15   [provider]  omeprazole (PRILOSEC OTC) 20 MG tablet Take 20 mg by mouth daily as needed. For heartburn/indigestion.    [provider]  ondansetron (ZOFRAN ODT) 4 MG disintegrating tablet Take 1 tablet (4 mg total) by mouth every 8 (eight) hours as needed for nausea or vomiting. 11/01/16   Paulette Blanch, MD    Allergies  Allergen Reactions  . Cephalexin Other (See Comments)  . Ciprofloxacin Other (See Comments)  . Hydrocodone-Chlorpheniramine Other (See Comments)    Caused excessive sleepiness  . Metronidazole Other (See Comments)  . Sulfa Antibiotics Other (See Comments)  . Sulfamethoxazole-Trimethoprim     Other reaction(s): Unknown  . Tetracyclines & Related Other (See Comments)  . Tussionex Pennkinetic Er [Hydrocod Polst-Cpm Polst Er] Other (See Comments)  Family History  Problem Relation Age of Onset  . Breast cancer Neg Hx     Social History Social History   Tobacco Use  . Smoking status: Never Smoker  . Smokeless tobacco: Never Used  Substance Use Topics  . Alcohol use: No  . Drug use: No    Review of Systems Constitutional: Negative for loss of consciousness Cardiovascular: Negative for chest pain. Respiratory: Negative for shortness of breath. Gastrointestinal: Negative for abdominal pain Musculoskeletal: Mild lower back pain.  Mild right shoulder pain, mild right hip pain. Neurological: Negative for headache All other  ROS negative  ____________________________________________   PHYSICAL EXAM:  VITAL SIGNS: ED Triage Vitals  Enc Vitals Group     BP 08/10/17 0606 (!) 166/73     Pulse Rate 08/10/17 0606 (!) 117     Resp 08/10/17 0606 20     Temp 08/10/17 0606 (!) 97.5 F (36.4 C)     Temp Source 08/10/17 0606 Oral     SpO2 08/10/17 0606 95 %     Weight 08/10/17 0606 130 lb (59 kg)     Height 08/10/17 0606 5\' 2"  (1.575 m)     Head Circumference --      Peak Flow --      Pain Score 08/10/17 0607 8     Pain Loc --      Pain Edu? --      Excl. in [place]? --    Constitutional: Alert and oriented x3. Well appearing and in no distress. Eyes: Normal exam ENT   Head: Normocephalic and atraumatic.   Mouth/Throat: Mucous membranes are moist. Cardiovascular: Normal rate, regular rhythm. No murmur Respiratory: Normal respiratory effort without tachypnea nor retractions. Breath sounds are clear  Gastrointestinal: Soft and nontender. No distention.   Musculoskeletal: Mild pain in right hip with range of motion, no tenderness to palpation.  Mild tenderness to the right shoulder but no pain with range of motion.  Both of which are neurovascularly intact.  Mild L-spine tenderness no C-spine tenderness. Neurologic:  Normal speech and language. No gross focal neurologic deficits Skin:  Skin is warm, dry.  Very small 1 cm skin tear to right elbow. Psychiatric: Mood and affect are normal.  ____________________________________________    RADIOLOGY  X-rays are negative. CT head negative.  ____________________________________________   INITIAL IMPRESSION / ASSESSMENT AND PLAN / ED COURSE  Pertinent labs & imaging results that were available during my care of the patient were reviewed by me and considered in my medical decision making (see chart for details).  The patient presents to the emergency department with what appears to be most consistent with a mechanical fall.  Overall the patient appears  very well, no distress.  Mild right hip tenderness mild right shoulder tenderness with mild lower back tenderness.  We will obtain images of lumbar spine, right shoulder, right hip/pelvis we will also obtain a CT scan of the head given a fall with head injury.  X-rays are negative for acute fracture.  CT head negative.  Patient's labs are largely within normal limits besides a questionable urinalysis.  We will dose with a one-time dose of fosfomycin and send a urine culture.  Patient agreeable to this plan of care.  She will be discharged to her nursing facility.  I reviewed the patient's records including most recent ER record for a previous fall.  ____________________________________________   FINAL CLINICAL IMPRESSION(S) / ED DIAGNOSES  Fall Head injury    Harvest Dark, MD 08/10/17 [phone]

## 2017-08-11 ENCOUNTER — Observation Stay
Admission: EM | Admit: 2017-08-11 | Discharge: 2017-08-13 | Disposition: A | Discharge status: Skilled Nursing Facility | Payer: Medicare Other | Attending: Internal Medicine | Admitting: Internal Medicine

## 2017-08-11 ENCOUNTER — Emergency Department: Payer: Medicare Other

## 2017-08-11 ENCOUNTER — Encounter: Payer: Self-pay | Admitting: Internal Medicine

## 2017-08-11 DIAGNOSIS — S32592A Other specified fracture of left pubis, initial encounter for closed fracture: Secondary | ICD-10-CM | POA: Diagnosis present

## 2017-08-11 DIAGNOSIS — S3210XA Unspecified fracture of sacrum, initial encounter for closed fracture: Secondary | ICD-10-CM | POA: Diagnosis not present

## 2017-08-11 DIAGNOSIS — E039 Hypothyroidism, unspecified: Secondary | ICD-10-CM | POA: Insufficient documentation

## 2017-08-11 DIAGNOSIS — I129 Hypertensive chronic kidney disease with stage 1 through stage 4 chronic kidney disease, or unspecified chronic kidney disease: Secondary | ICD-10-CM | POA: Insufficient documentation

## 2017-08-11 DIAGNOSIS — Z882 Allergy status to sulfonamides status: Secondary | ICD-10-CM | POA: Insufficient documentation

## 2017-08-11 DIAGNOSIS — Z66 Do not resuscitate: Secondary | ICD-10-CM | POA: Insufficient documentation

## 2017-08-11 DIAGNOSIS — B961 Klebsiella pneumoniae [K. pneumoniae] as the cause of diseases classified elsewhere: Secondary | ICD-10-CM | POA: Insufficient documentation

## 2017-08-11 DIAGNOSIS — Z79899 Other long term (current) drug therapy: Secondary | ICD-10-CM | POA: Diagnosis not present

## 2017-08-11 DIAGNOSIS — F0390 Unspecified dementia without behavioral disturbance: Secondary | ICD-10-CM | POA: Insufficient documentation

## 2017-08-11 DIAGNOSIS — W19XXXA Unspecified fall, initial encounter: Secondary | ICD-10-CM | POA: Insufficient documentation

## 2017-08-11 DIAGNOSIS — E785 Hyperlipidemia, unspecified: Secondary | ICD-10-CM | POA: Diagnosis not present

## 2017-08-11 DIAGNOSIS — Y9289 Other specified places as the place of occurrence of the external cause: Secondary | ICD-10-CM | POA: Insufficient documentation

## 2017-08-11 DIAGNOSIS — Z515 Encounter for palliative care: Secondary | ICD-10-CM

## 2017-08-11 DIAGNOSIS — N183 Chronic kidney disease, stage 3 (moderate): Secondary | ICD-10-CM | POA: Diagnosis not present

## 2017-08-11 DIAGNOSIS — M109 Gout, unspecified: Secondary | ICD-10-CM | POA: Insufficient documentation

## 2017-08-11 DIAGNOSIS — Z853 Personal history of malignant neoplasm of breast: Secondary | ICD-10-CM | POA: Diagnosis not present

## 2017-08-11 DIAGNOSIS — N3000 Acute cystitis without hematuria: Secondary | ICD-10-CM | POA: Insufficient documentation

## 2017-08-11 HISTORY — DX: Gout, unspecified: M10.9

## 2017-08-11 LAB — BASIC METABOLIC PANEL
Anion gap: 10 (ref 5–15)
BUN: 31 mg/dL — AB (ref 6–20)
CO2: 26 mmol/L (ref 22–32)
CREATININE: 1.28 mg/dL — AB (ref 0.44–1.00)
Calcium: 10.3 mg/dL (ref 8.9–10.3)
Chloride: 101 mmol/L (ref 101–111)
GFR, EST AFRICAN AMERICAN: 42 mL/min — AB (ref >60)
GFR, EST NON AFRICAN AMERICAN: 36 mL/min — AB (ref >60)
Glucose, Bld: 84 mg/dL (ref 65–99)
POTASSIUM: 4.3 mmol/L (ref 3.5–5.1)
SODIUM: 137 mmol/L (ref 135–145)

## 2017-08-11 LAB — CBC WITH DIFFERENTIAL/PLATELET
BASOS ABS: 0.1 10*3/uL (ref 0–0.1)
Basophils Relative: 1 %
EOS ABS: 0.4 10*3/uL (ref 0–0.7)
EOS PCT: 5 %
HCT: 41.3 % (ref 35.0–47.0)
Hemoglobin: 13.9 g/dL (ref 12.0–16.0)
LYMPHS PCT: 23 %
Lymphs Abs: 2.2 10*3/uL (ref 1.0–3.6)
MCH: 30.9 pg (ref 26.0–34.0)
MCHC: 33.5 g/dL (ref 32.0–36.0)
MCV: 92 fL (ref 80.0–100.0)
Monocytes Absolute: 0.8 10*3/uL (ref 0.2–0.9)
Monocytes Relative: 8 %
NEUTROS PCT: 63 %
Neutro Abs: 6 10*3/uL (ref 1.4–6.5)
PLATELETS: 197 10*3/uL (ref 150–440)
RBC: 4.49 MIL/uL (ref 3.80–5.20)
RDW: 14.9 % — ABNORMAL HIGH (ref 11.5–14.5)
WBC: 9.5 10*3/uL (ref 3.6–11.0)

## 2017-08-11 LAB — MRSA PCR SCREENING: MRSA BY PCR: NEGATIVE

## 2017-08-11 MED ORDER — ACETAMINOPHEN 325 MG PO TABS
650.0000 mg | ORAL_TABLET | Freq: Four times a day (QID) | ORAL | Status: DC | PRN
  Administered 2017-08-11 – 2017-08-12 (×4): 650 mg via ORAL
  Filled 2017-08-11 (×4): qty 2

## 2017-08-11 MED ORDER — AMLODIPINE BESYLATE 5 MG PO TABS
5.0000 mg | ORAL_TABLET | Freq: Every day | ORAL | Status: DC
  Administered 2017-08-12 – 2017-08-13 (×2): 5 mg via ORAL
  Filled 2017-08-11 (×2): qty 1

## 2017-08-11 MED ORDER — PANTOPRAZOLE SODIUM 40 MG PO TBEC: 40.0000 mg | DELAYED_RELEASE_TABLET | Freq: Every day | ORAL | Status: DC | PRN

## 2017-08-11 MED ORDER — ATORVASTATIN CALCIUM 20 MG PO TABS
20.0000 mg | ORAL_TABLET | Freq: Every day | ORAL | Status: DC
  Filled 2017-08-11 (×2): qty 1

## 2017-08-11 MED ORDER — ALLOPURINOL 100 MG PO TABS
100.0000 mg | ORAL_TABLET | Freq: Every day | ORAL | Status: DC
  Administered 2017-08-12 – 2017-08-13 (×2): 100 mg via ORAL
  Filled 2017-08-11 (×3): qty 1

## 2017-08-11 MED ORDER — ACETAMINOPHEN 325 MG PO TABS
650.0000 mg | ORAL_TABLET | Freq: Once | ORAL | Status: AC
  Administered 2017-08-11: 650 mg via ORAL
  Filled 2017-08-11: qty 2

## 2017-08-11 MED ORDER — ACETAMINOPHEN 650 MG RE SUPP: 650.0000 mg | Freq: Four times a day (QID) | RECTAL | Status: DC | PRN

## 2017-08-11 MED ORDER — ENALAPRIL MALEATE 10 MG PO TABS
20.0000 mg | ORAL_TABLET | Freq: Two times a day (BID) | ORAL | Status: DC
  Administered 2017-08-11 – 2017-08-13 (×4): 20 mg via ORAL
  Filled 2017-08-11 (×4): qty 2

## 2017-08-11 MED ORDER — LEVOTHYROXINE SODIUM 112 MCG PO TABS
112.0000 ug | ORAL_TABLET | Freq: Every day | ORAL | Status: DC
  Administered 2017-08-12 – 2017-08-13 (×2): 112 ug via ORAL
  Filled 2017-08-11 (×2): qty 1

## 2017-08-11 MED ORDER — ENOXAPARIN SODIUM 30 MG/0.3ML ~~LOC~~ SOLN
30.0000 mg | SUBCUTANEOUS | Status: DC
  Administered 2017-08-11 – 2017-08-12 (×2): 30 mg via SUBCUTANEOUS
  Filled 2017-08-11 (×2): qty 0.3

## 2017-08-11 MED ORDER — TRAMADOL HCL 50 MG PO TABS: 50.0000 mg | ORAL_TABLET | Freq: Four times a day (QID) | ORAL | Status: DC | PRN

## 2017-08-11 MED ORDER — OMEPRAZOLE MAGNESIUM 20 MG PO TBEC: 20.0000 mg | DELAYED_RELEASE_TABLET | Freq: Every day | ORAL | Status: DC | PRN

## 2017-08-11 MED ORDER — MORPHINE SULFATE (PF) 2 MG/ML IV SOLN: 1.0000 mg | INTRAVENOUS | Status: DC | PRN

## 2017-08-11 NOTE — H&P (Signed)
[facility] [place] NAME: Samantha Bowen    MR#:  [id number]  DATE OF BIRTH:  [birth year]  DATE OF ADMISSION:  08/11/2017  PRIMARY CARE PHYSICIAN: Dion Body, MD   REQUESTING/REFERRING PHYSICIAN: Dr Lisa Roca  CHIEF COMPLAINT:   Chief Complaint  Patient presents with  . Fall    HISTORY OF PRESENT ILLNESS:  Samantha Bowen  is a 83 y.o. female  who presented to the ER yesterday with a fall.  She was unable to describe the fall from a but fell in the bathroom when she turned the wrong way.  She hit her head and back.  She crawled out into the bedroom.  She was evaluated in the ER and then sent home from the ER yesterday.  Normally she walks with a walker.  Yesterday she could not put weight on her left leg.  Her right side hurts elbow, ankle and groin.  Also pain in the middle of her back 5 out of 10 in intensity.  A CT scan today showed a inferior right pubic rami fracture and a right sacral fracture.  Hospitalist services were contacted for further evaluation  PAST MEDICAL HISTORY:   Past Medical History:  Diagnosis Date  . Breast cancer ([place]) 09/15/2008   lumpectomy with Radiation  . Cancer [facility])    Breast Cancer  . Gout   . High cholesterol   . Hypertension   . Renal disorder   . Thyroid disease     PAST SURGICAL HISTORY:   Past Surgical History:  Procedure Laterality Date  . ABDOMINAL HYSTERECTOMY    . BREAST BIOPSY Left 2009   stereo Bx   +  . BREAST LUMPECTOMY Left 2009   with radiation    SOCIAL HISTORY:   Social History   Tobacco Use  . Smoking status: Never Smoker  . Smokeless tobacco: Never Used  Substance Use Topics  . Alcohol use: No    FAMILY HISTORY:   Family History  Problem Relation Age of Onset  . CVA Mother   . Breast cancer Neg Hx     DRUG ALLERGIES:   Allergies  Allergen Reactions  . Cephalexin Other (See Comments)  . Ciprofloxacin Other (See Comments)  .  Hydrocodone-Chlorpheniramine Other (See Comments)    Caused excessive sleepiness  . Metronidazole Other (See Comments)  . Sulfa Antibiotics Other (See Comments)  . Sulfamethoxazole-Trimethoprim     Other reaction(s): Unknown  . Tetracyclines & Related Other (See Comments)  . Tussionex Pennkinetic Er [Hydrocod Polst-Cpm Polst Er] Other (See Comments)    REVIEW OF SYSTEMS:  CONSTITUTIONAL: No fever, chills or sweats.  Positive for fatigue.  EYES: No blurred or double vision.  Wears reading glasses. EARS, NOSE, AND THROAT: No tinnitus or ear pain. No sore throat.  Decreased hearing. RESPIRATORY: Some coughing whitish phlegm.  No shortness of breath, wheezing or hemoptysis.  CARDIOVASCULAR: No chest pain, orthopnea, edema.  GASTROINTESTINAL: No nausea, vomiting, diarrhea or abdominal pain. No blood in bowel movements GENITOURINARY: No dysuria, hematuria.  ENDOCRINE: No polyuria, nocturia,  HEMATOLOGY: No anemia, easy bruising or bleeding SKIN: No rash or lesion. MUSCULOSKELETAL: Positive for back pain, right groin pain, right ankle pain and right elbow pain NEUROLOGIC: No tingling, numbness, weakness.  PSYCHIATRY: No anxiety or depression.   MEDICATIONS AT HOME:   Prior to Admission medications   Medication Sig Start Date End Date Taking? Authorizing Provider  acetaminophen (TYLENOL) 500 MG tablet Take 500 mg by mouth  every 6 (six) hours as needed for mild pain, moderate pain or fever.    Yes [provider]  allopurinol (ZYLOPRIM) 100 MG tablet Take 100 mg by mouth daily. 11/15/15  Yes [provider]  amLODipine (NORVASC) 5 MG tablet Take 5 mg by mouth daily.  03/24/15  Yes [provider]  atorvastatin (LIPITOR) 20 MG tablet Take 20 mg by mouth daily at 6 PM.   Yes [provider]  enalapril (VASOTEC) 20 MG tablet Take 20 mg by mouth 2 (two) times daily.  04/13/15  Yes [provider]  levothyroxine (SYNTHROID, LEVOTHROID) 112 MCG tablet Take  112 mcg by mouth daily before breakfast.   Yes [provider]  omeprazole (PRILOSEC OTC) 20 MG tablet Take 20 mg by mouth daily as needed. For heartburn/indigestion.   Yes [provider]      VITAL SIGNS:  Blood pressure 137/75, pulse (!) 103, temperature 97.7 F (36.5 C), temperature source Oral, resp. rate 17, height 5\' 2"  (1.575 m), weight 59 kg (130 lb), SpO2 91 %.  PHYSICAL EXAMINATION:  GENERAL:  83 y.o.-year-old patient lying in the bed with no acute distress.  EYES: Pupils equal, round, reactive to light and accommodation. No scleral icterus. Extraocular muscles intact.  HEENT: Head atraumatic, normocephalic. Oropharynx and nasopharynx clear.  NECK:  Supple, no jugular venous distention. No thyroid enlargement, no tenderness.  LUNGS: Normal breath sounds bilaterally, no wheezing, rales,rhonchi or crepitation. No use of accessory muscles of respiration.  CARDIOVASCULAR: S1, S2 normal. No murmurs, rubs, or gallops.  ABDOMEN: Soft, nontender, nondistended. Bowel sounds present. No organomegaly or mass.  EXTREMITIES: No pedal edema, cyanosis, or clubbing.  NEUROLOGIC: Cranial nerves II through XII are intact.  Patient barely able to straight leg raise with the right leg and left leg.  Sensation intact. Gait not checked.  Power 5 out of 5 bilateral ankles. PSYCHIATRIC: The patient is alert and oriented x 3.  SKIN: Skin tear right elbow  LABORATORY PANEL:   CBC Recent Labs  Lab 08/11/17 1453  WBC 9.5  HGB 13.9  HCT 41.3  PLT 197   ------------------------------------------------------------------------------------------------------------------  Chemistries  Recent Labs  Lab 08/10/17 0631 08/11/17 1453  NA 138 137  K 4.2 4.3  CL 105 101  CO2 27 26  GLUCOSE 101* 84  BUN 34* 31*  CREATININE 1.31* 1.28*  CALCIUM 10.3 10.3  AST 40  --   ALT 35  --   ALKPHOS 111  --   BILITOT 0.8  --     ------------------------------------------------------------------------------------------------------------------   RADIOLOGY:  Dg Lumbar Spine Complete  Result Date: 08/10/2017 CLINICAL DATA:  Fall this morning. Low back pain. Initial encounter. EXAM: LUMBAR SPINE - COMPLETE 4+ VIEW COMPARISON:  09/30/2013 FINDINGS: There is no evidence of lumbar spine fracture. Alignment is normal. Stable mild degenerative disc disease, greatest at L3-4. Moderate bilateral lower lumbar facet DJD, mainly at L5-S1. Generalized osteopenia. No focal lytic or sclerotic bone lesions identified. Aortic atherosclerosis. IMPRESSION: Stable exam. No acute findings. Degenerative spondylosis, as described above. Electronically Signed   By: Earle Gell M.D.   On: 08/10/2017 07:49   Dg Shoulder Right  Result Date: 08/10/2017 CLINICAL DATA:  Fall this morning. Right-sided shoulder pain. Initial encounter. EXAM: RIGHT SHOULDER - 2+ VIEW COMPARISON:  None. FINDINGS: There is no evidence of fracture or dislocation. There is no evidence of arthropathy or other focal bone abnormality. Generalized osteopenia. Soft tissues are unremarkable. IMPRESSION: No acute findings.  Osteopenia. Electronically Signed  By: Earle Gell M.D.   On: 08/10/2017 07:50   Dg Elbow Complete Right  Result Date: 08/10/2017 CLINICAL DATA:  Fall this morning. Right elbow pain. Initial encounter. EXAM: RIGHT ELBOW - COMPLETE 3+ VIEW COMPARISON:  None. FINDINGS: There is no evidence of fracture, dislocation, or joint effusion. Olecranon process bone spur noted. No other significant osseous abnormality identified. Soft tissues are unremarkable. IMPRESSION: No acute findings.  Olecranon process bone spur noted. Electronically Signed   By: Earle Gell M.D.   On: 08/10/2017 07:51   Ct Head Wo Contrast  Result Date: 08/10/2017 CLINICAL DATA:  Fall while walking to bathroom this morning. Initial encounter. EXAM: CT HEAD WITHOUT CONTRAST TECHNIQUE: Contiguous  axial images were obtained from the base of the skull through the vertex without intravenous contrast. COMPARISON:  04/11/2017 FINDINGS: Brain: There is no evidence of acute infarct, intracranial hemorrhage, mass, midline shift, or extra-axial fluid collection. 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 Confluent cerebral white matter hypodensities are similar to the prior study and nonspecific but compatible with extensive chronic small vessel ischemic disease. Generalized cerebral atrophy is mild for age. Vascular: Calcified atherosclerosis at the skullbase. No hyperdense vessel. Skull: No fracture or suspicious osseous lesion. Sinuses/Orbits: Mild mucosal thickening in the paranasal sinuses. Clear mastoid air cells. Bilateral cataract extraction. Other: None. IMPRESSION: 1. No evidence of acute intracranial abnormality. 2. Extensive chronic small vessel ischemic disease. Electronically Signed   By: Logan Bores M.D.   On: 08/10/2017 08:30   Ct Pelvis Wo Contrast  Result Date: 08/11/2017 CLINICAL DATA:  83 y/o  F; fall with right hip pain. EXAM: CT PELVIS WITHOUT CONTRAST TECHNIQUE: Multidetector CT imaging of the pelvis was performed following the standard protocol without intravenous contrast. COMPARISON:  None. FINDINGS: Urinary Tract:  No abnormality visualized. Bowel:  Sigmoid diverticulosis. Vascular/Lymphatic: Calcific atherosclerosis of the iliofemoral arteries. Reproductive: No mass or other significant abnormality. Hysterectomy. Other:  None. Musculoskeletal: Acute mildly displaced fracture of inferior pubic ramus near symphysis (series 2, image 94). Acute nondisplaced mild fracture of left inferior pubic ramus near the ischial tuberosity (series 2, image 100). Minimally displaced acute fracture right sacral ala (series 2, image 36). IMPRESSION: 1. Acute mildly displaced fracture of inferior pubic ramus near symphysis and nondisplaced buckle fracture right inferior pubic  ramus near ischial tuberosity. 2. Acute minimally displaced fracture right sacral ala. Electronically Signed   By: Kristine Garbe M.D.   On: 08/11/2017 14:19   Dg Hip Unilat W Or Wo Pelvis 2-3 Views Right  Result Date: 08/10/2017 CLINICAL DATA:  Fall this morning. Right hip pain. Initial encounter. EXAM: DG HIP (WITH OR WITHOUT PELVIS) 2-3V RIGHT COMPARISON:  04/11/2017 FINDINGS: There is no evidence of hip fracture or dislocation. Mild right hip degenerative spurring without significant joint space narrowing. No other bone lesions identified. IMPRESSION: No acute findings. Mild right hip osteoarthritis. Electronically Signed   By: Earle Gell M.D.   On: 08/10/2017 07:46     IMPRESSION AND PLAN:   1.  Sacral fracture and inferior right pubic rami fracture.  Pain control with Tylenol for mild pain, tramadol for moderate pain and morphine for severe pain.  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  If patient having uncontrollable pain can consider consult for Dr. Rudene Christians for sacralplasty.  Family will wait and see how things are tomorrow before making that decision. 2.  Acute cystitis without hematuria.  Urine culture growing gram-negative rods.  Patient with multiple  drug allergies.  Was given fosfomycin yesterday in the emergency room. 3.  Essential hypertension continue usual medications 4.  Chronic kidney disease stage III. 5.  History of gout on allopurinol 6.  Hyperlipidemia unspecified on Lipitor 7.  Hypothyroidism unspecified on levothyroxine 8.  History of dementia 9.  History of breast cancer status post radiation therapy and lumpectomy in the past.    All the records are reviewed and case discussed with ED provider. Management plans discussed with the patient, family and they are in agreement.  CODE STATUS: DNR  TOTAL TIME TAKING CARE OF THIS PATIENT: 50 minutes.    Loletha Grayer M.D on 08/11/2017 at 4:19 PM  Between 7am to 6pm - Pager -  [phone]  After 6pm call admission pager [phone]  [facility]  [phone]  CC: Primary care physician; Dion Body, MD

## 2017-08-11 NOTE — Progress Notes (Signed)
Anticoagulation monitoring(Lovenox):  83 yo female ordered Lovenox 40 mg Q24h  Filed Weights   08/11/17 1000  Weight: 130 lb (59 kg)   BMI    Lab Results  Component Value Date   CREATININE 1.28 (H) 08/11/2017   CREATININE 1.31 (H) 08/10/2017   CREATININE 1.64 (H) 04/11/2017   Estimated Creatinine Clearance: 23.6 mL/min (A) (by C-G formula based on SCr of 1.28 mg/dL (H)). Hemoglobin & Hematocrit     Component Value Date/Time   HGB 13.9 08/11/2017 1453   HGB 13.4 07/19/2014 1442   HCT 41.3 08/11/2017 1453   HCT 40.6 07/19/2014 1442     Per Protocol for Patient with estCrcl < 30 ml/min and BMI < 40, will transition to Lovenox 30 mg Q24h.

## 2017-08-11 NOTE — ED Triage Notes (Signed)
Patient fell yesterday at [facility] and was subsequently evaluated at this facility for right sided injuries. Family brings patient back today for evaluation for same fall. Family states patient is unable to stand and pivot on left leg and is requesting a pelvis xray.

## 2017-08-11 NOTE — ED Provider Notes (Signed)
[facility] Emergency Department Provider Note ____________________________________________   I have reviewed the triage vital signs and the triage nursing note.  HISTORY  Chief Complaint Fall   Historian Patient  HPI Samantha Bowen is a 83 y.o. female from [place] assisted living, had a mechanical fall yesterday and was evaluated for leg pain with x-rays which were negative.  Patient walks with a walker and was really unable to bear weight at all this morning, patient brought back for reevaluation thinking that perhaps the left leg may be injured.  No new injury, the fall was yesterday.  Pain is located at the right hip per the patient, 5 out of 10.    Past Medical History:  Diagnosis Date  . Breast cancer ([place]) 09/15/2008   lumpectomy with Radiation  . Cancer [facility])    Breast Cancer  . High cholesterol   . Hypertension   . Renal disorder   . Thyroid disease     Patient Active Problem List   Diagnosis Date Noted  . Primary cancer of left female breast ([place]) 08/26/2016  . Pneumonia 01/15/2016    Past Surgical History:  Procedure Laterality Date  . BREAST BIOPSY Left 2009   stereo Bx   +  . BREAST LUMPECTOMY Left 2009   with radiation    Prior to Admission medications   Medication Sig Start Date End Date Taking? Authorizing Provider  acetaminophen (TYLENOL) 500 MG tablet Take 500 mg by mouth every 6 (six) hours as needed for mild pain, moderate pain or fever.     [provider]  allopurinol (ZYLOPRIM) 100 MG tablet Take 100 mg by mouth daily. 11/15/15   [provider]  amLODipine (NORVASC) 5 MG tablet Take 5 mg by mouth daily.  03/24/15   [provider]  atorvastatin (LIPITOR) 20 MG tablet Take 20 mg by mouth daily at 6 PM.    [provider]  enalapril (VASOTEC) 20 MG tablet Take 20 mg by mouth 2 (two) times daily.  04/13/15   [provider]  erythromycin ophthalmic ointment Place 1 application  into the left eye 4 (four) times daily. For 1 week 05/20/15   Lisa Roca, MD  levothyroxine (SYNTHROID, LEVOTHROID) 100 MCG tablet Take 100 mcg by mouth daily before breakfast. *Take 30 to 60 minutes before breakfast*.    [provider]  levothyroxine (SYNTHROID, LEVOTHROID) 112 MCG tablet Take 112 mcg by mouth daily before breakfast.    [provider]  lovastatin (MEVACOR) 40 MG tablet Take 40 mg by mouth at bedtime. 10/25/15   [provider]  omeprazole (PRILOSEC OTC) 20 MG tablet Take 20 mg by mouth daily as needed. For heartburn/indigestion.    [provider]  ondansetron (ZOFRAN ODT) 4 MG disintegrating tablet Take 1 tablet (4 mg total) by mouth every 8 (eight) hours as needed for nausea or vomiting. 11/01/16   Paulette Blanch, MD    Allergies  Allergen Reactions  . Cephalexin Other (See Comments)  . Ciprofloxacin Other (See Comments)  . Hydrocodone-Chlorpheniramine Other (See Comments)    Caused excessive sleepiness  . Metronidazole Other (See Comments)  . Sulfa Antibiotics Other (See Comments)  . Sulfamethoxazole-Trimethoprim     Other reaction(s): Unknown  . Tetracyclines & Related Other (See Comments)  . Tussionex Pennkinetic Er [Hydrocod Polst-Cpm Polst Er] Other (See Comments)    Family History  Problem Relation Age of Onset  . Breast cancer Neg Hx     Social History  Social History   Tobacco Use  . Smoking status: Never Smoker  . Smokeless tobacco: Never Used  Substance Use Topics  . Alcohol use: No  . Drug use: No    Review of Systems  Constitutional: Negative for fever. Eyes: Negative for visual changes. ENT: Negative for sore throat. Cardiovascular: Negative for chest pain. Respiratory: Negative for shortness of breath. Gastrointestinal: Negative for abdominal pain, vomiting and diarrhea. Genitourinary: Negative for dysuria. Musculoskeletal: Negative for back pain.  Pain at right hip. Skin: Negative for  rash. Neurological: Negative for headache.  ____________________________________________   PHYSICAL EXAM:  VITAL SIGNS: ED Triage Vitals  Enc Vitals Group     BP 08/11/17 0959 136/71     Pulse Rate 08/11/17 0959 99     Resp 08/11/17 0959 18     Temp 08/11/17 0959 97.7 F (36.5 C)     Temp Source 08/11/17 0959 Oral     SpO2 08/11/17 0959 96 %     Weight 08/11/17 1000 130 lb (59 kg)     Height 08/11/17 1000 5\' 2"  (1.575 m)     Head Circumference --      Peak Flow --      Pain Score 08/11/17 0958 2     Pain Loc --      Pain Edu? --      Excl. in [place]? --      Constitutional: Alert and cooperative. Well appearing and in no distress. HEENT   Head: Normocephalic and atraumatic.      Eyes: Conjunctivae are normal. Pupils equal and round.       Ears:         Nose: No congestion/rhinnorhea.   Mouth/Throat: Mucous membranes are moist.   Neck: No stridor.  nontender cspine. Cardiovascular/Chest: Normal rate, regular rhythm.  No murmurs, rubs, or gallops. Respiratory: Normal respiratory effort without tachypnea nor retractions. Breath sounds are clear and equal bilaterally. No wheezes/rales/rhonchi. Gastrointestinal: Soft. No distention, no guarding, no rebound. Nontender.    Genitourinary/rectal:Deferred Musculoskeletal: Pain at right lateral hip and buttock.  Pain with movement of both legs into pelvis. No leg shortening or rotation.   Neurologic:  Normal speech and language. No gross or focal neurologic deficits are appreciated. Skin:  Skin is warm, dry and intact. No rash noted. Psychiatric: No agitation.   ____________________________________________  LABS (pertinent positives/negatives) I, Lisa Roca, MD the attending physician have reviewed the labs noted below.  Labs Reviewed  URINALYSIS, COMPLETE (UACMP) WITH MICROSCOPIC  BASIC METABOLIC PANEL  CBC WITH DIFFERENTIAL/PLATELET    ____________________________________________    EKG I, Lisa Roca, MD,  the attending physician have personally viewed and interpreted all ECGs.  None ____________________________________________  RADIOLOGY All Xrays were viewed by me.  Imaging interpreted by Radiologist, and I, Lisa Roca, MD the attending physician have reviewed the radiologist interpretation noted below.  CTpelvis:  IMPRESSION: 1. Acute mildly displaced fracture of inferior pubic ramus near symphysis and nondisplaced buckle fracture right inferior pubic ramus near ischial tuberosity. 2. Acute minimally displaced fracture right sacral ala. __________________________________________  PROCEDURES  Procedure(s) performed: None  Critical Care performed: None   ____________________________________________  ED COURSE / ASSESSMENT AND PLAN  Pertinent labs & imaging results that were available during my care of the patient were reviewed by me and considered in my medical decision making (see chart for details).    Patient unable to bear weight after a fall yesterday, CT of the pelvis does show multiple pelvis/sacral fractures.  Family states patient cannot  tolerate narcotics.  I will give her a dose of Tylenol.  Patient is from assisted living and will need a higher level of care.  I will attempt to see if it is possible to get her to a higher level of care through the ED with a PT and social work consult, alternatively may need to be admitted to the hospital for acute fracture and then disposition management from there.  Patient care to be transferred to Dr. Jimmye Norman at 3 PM.  Disposition per social work/physical therapy.    CONSULTATIONS:  PT and social work for placement.   Patient / Family / Caregiver informed of clinical course, medical decision-making process, and agree with plan.    ___________________________________________   FINAL CLINICAL IMPRESSION(S) / ED DIAGNOSES   Final diagnoses:  Closed fracture of left inferior pubic ramus, initial encounter ([place])   Closed fracture of sacrum, unspecified fracture morphology, initial encounter [facility])      ___________________________________________  ED Discharge Orders    None            Note: This dictation was prepared with Dragon dictation. Any transcriptional errors that result from this process are unintentional    Lisa Roca, MD 08/11/17 1444

## 2017-08-11 NOTE — NC FL2 (Signed)
[place] LEVEL OF CARE SCREENING TOOL     IDENTIFICATION  Patient Name: Samantha Bowen Birthdate: 07-18-1926 Sex: female Admission Date (Current Location): 08/11/2017  [place] and [place] Number:  Engineering geologist and Address:  [facility], [address], [place], [place] [address]      Provider Number: [id number]  Attending Physician Name and Address:  Lisa Roca, MD  Relative Name and Phone Number:       Current Level of Care: Hospital Recommended Level of Care: [place] Prior Approval Number:    Date Approved/Denied:   PASRR Number: [id number]  Discharge Plan: SNF    Current Diagnoses: Patient Active Problem List   Diagnosis Date Noted  . Primary cancer of left female breast ([place]) 08/26/2016  . Pneumonia 01/15/2016    Orientation RESPIRATION BLADDER Height & Weight     Self, Time, Place, Situation  Normal Continent Weight: 130 lb (59 kg) Height:  5\' 2"  (157.5 cm)  BEHAVIORAL SYMPTOMS/MOOD NEUROLOGICAL BOWEL NUTRITION STATUS      Continent Diet(Normal)  AMBULATORY STATUS COMMUNICATION OF NEEDS Skin   Limited Assist Verbally Normal                       Personal Care Assistance Level of Assistance  Bathing, Feeding, Dressing, Total care Bathing Assistance: Limited assistance Feeding assistance: Independent Dressing Assistance: Independent Total Care Assistance: Limited assistance   Functional Limitations Info  Sight, Hearing, Speech Sight Info: Adequate Hearing Info: Impaired(Hard of Hearing)      SPECIAL CARE FACTORS FREQUENCY  PT (By licensed PT), OT (By licensed OT)     PT Frequency: x5 OT Frequency: x5            Contractures Contractures Info: Not present    Additional Factors Info  Allergies   Allergies Info: Cephalexin, Ciprofloxacin, Hydrocodone-chlorpheniramine, Metronidazole, Sulfa Antibiotics, Sulfamethoxazole-trimethoprim, Tetracyclines & Related, Tussionex  Pennkinetic Er Hydrocod Polst-cpm Polst Er           Current Medications (08/11/2017):  This is the current hospital active medication list No current facility-administered medications for this encounter.    Current Outpatient Medications  Medication Sig Dispense Refill  . acetaminophen (TYLENOL) 500 MG tablet Take 500 mg by mouth every 6 (six) hours as needed for mild pain, moderate pain or fever.     Marland Kitchen allopurinol (ZYLOPRIM) 100 MG tablet Take 100 mg by mouth daily.    Marland Kitchen amLODipine (NORVASC) 5 MG tablet Take 5 mg by mouth daily.     Marland Kitchen atorvastatin (LIPITOR) 20 MG tablet Take 20 mg by mouth daily at 6 PM.    . enalapril (VASOTEC) 20 MG tablet Take 20 mg by mouth 2 (two) times daily.     Marland Kitchen erythromycin ophthalmic ointment Place 1 application into the left eye 4 (four) times daily. For 1 week 3.5 g 0  . levothyroxine (SYNTHROID, LEVOTHROID) 100 MCG tablet Take 100 mcg by mouth daily before breakfast. *Take 30 to 60 minutes before breakfast*.    Marland Kitchen levothyroxine (SYNTHROID, LEVOTHROID) 112 MCG tablet Take 112 mcg by mouth daily before breakfast.    . lovastatin (MEVACOR) 40 MG tablet Take 40 mg by mouth at bedtime.    Marland Kitchen omeprazole (PRILOSEC OTC) 20 MG tablet Take 20 mg by mouth daily as needed. For heartburn/indigestion.    . ondansetron (ZOFRAN ODT) 4 MG disintegrating tablet Take 1 tablet (4 mg total) by mouth every 8 (eight) hours as needed for nausea  or vomiting. 20 tablet 0     Discharge Medications: Please see discharge summary for a list of discharge medications.  Relevant Imaging Results:  Relevant Lab Results:   Additional Information SSN [id number]  Joana Reamer, [place]

## 2017-08-11 NOTE — Clinical Social Work Note (Signed)
Clinical Social Work Assessment  Patient Details  Name: Samantha Bowen MRN: [id number] Date of Birth: [birth year]  Date of referral:  08/11/17               Reason for consult:  Facility Placement                Permission sought to share information with:  Family Supports, Customer service manager Permission granted to share information::  Yes, Verbal Permission Granted  Name::     Daughter Freda Munro [phone]  Agency::  all facilities  Relationship::     Contact Information:     Housing/Transportation Living arrangements for the past 2 months:  [place] of Information:  Patient, Adult Children Patient Interpreter Needed:  None Criminal Activity/Legal Involvement Pertinent to Current Situation/Hospitalization:  No - Comment as needed Significant Relationships:  Adult Children Lives with:  Self Do you feel safe going back to the place where you live?    Need for family participation in patient care:  Yes (Comment)  Care giving concerns: Patient resides at [facility] assisted living but isnt able to manage until her fractures heal. SNF-STR required   Social Worker assessment / plan: LCSW introduced myself to patient and daughter Hadassah Pais [phone]. Patient is independent with all her ADL but due to recent pelvic fractures she will need STR and SNF stay, Patient is awaiting PT consult. Patient is orientated x4 and is insured by [facility]. Patient has excellent family support and her husband recently passed ( Feb 2018) Patient has lived a [facility] for 2 months. Patient assessment and Fl2 uploaded to Hub and Passr number obtained. Bed offer has been made from [place] at [place] and will transfer to semi private room tomorrow. Awaiting PT consult. Patient will be admitted and DC to SNF tomorrow.  Employment status:  Retired Forensic scientist:  Medicare, Sports coach Pay (Medicaid Pending)([place]) PT Recommendations:     Information / Referral to community resources:  Acute Rehab  Patient/Family's Response to care: She requires higher level of care.  Patient/Family's Understanding of and Emotional Response to Diagnosis, Current Treatment, and Prognosis:  Patient understands she needs a little bit of help til her pain is managed and fractures heal.  Emotional Assessment Appearance:  Appears stated age Attitude/Demeanor/Rapport:  (Polite, Cooperative) Affect (typically observed):  Accepting, Adaptable, Calm Orientation:    Alcohol / Substance use:  Not Applicable Psych involvement (Current and /or in the community):  No (Comment)  Discharge Needs  Concerns to be addressed:  No discharge needs identified Readmission within the last 30 days:  No Current discharge risk:  None Barriers to Discharge:  No Barriers Identified   Joana Reamer, LCSW 08/11/2017, 3:33 PM

## 2017-08-12 ENCOUNTER — Other Ambulatory Visit: Payer: Self-pay

## 2017-08-12 ENCOUNTER — Encounter
Admission: RE | Admit: 2017-08-12 | Discharge: 2017-08-12 | Disposition: A | Discharge status: Home / Self Care | Payer: Medicare Other | Source: Ambulatory Visit | Attending: Internal Medicine | Admitting: Internal Medicine

## 2017-08-12 DIAGNOSIS — Z66 Do not resuscitate: Secondary | ICD-10-CM | POA: Diagnosis not present

## 2017-08-12 DIAGNOSIS — Z515 Encounter for palliative care: Secondary | ICD-10-CM

## 2017-08-12 DIAGNOSIS — S32592A Other specified fracture of left pubis, initial encounter for closed fracture: Secondary | ICD-10-CM | POA: Diagnosis not present

## 2017-08-12 LAB — URINE CULTURE: Culture: >=100000 — AB

## 2017-08-12 LAB — BASIC METABOLIC PANEL
Anion gap: 11 (ref 5–15)
BUN: 31 mg/dL — ABNORMAL HIGH (ref 6–20)
CALCIUM: 9.8 mg/dL (ref 8.9–10.3)
CO2: 24 mmol/L (ref 22–32)
CREATININE: 1.33 mg/dL — AB (ref 0.44–1.00)
Chloride: 101 mmol/L (ref 101–111)
GFR, EST AFRICAN AMERICAN: 40 mL/min — AB (ref >60)
GFR, EST NON AFRICAN AMERICAN: 34 mL/min — AB (ref >60)
Glucose, Bld: 83 mg/dL (ref 65–99)
Potassium: 4.7 mmol/L (ref 3.5–5.1)
SODIUM: 136 mmol/L (ref 135–145)

## 2017-08-12 LAB — CBC
HCT: 36.8 % (ref 35.0–47.0)
Hemoglobin: 12.4 g/dL (ref 12.0–16.0)
MCH: 31.1 pg (ref 26.0–34.0)
MCHC: 33.6 g/dL (ref 32.0–36.0)
MCV: 92.5 fL (ref 80.0–100.0)
PLATELETS: 179 10*3/uL (ref 150–440)
RBC: 3.98 MIL/uL (ref 3.80–5.20)
RDW: 15.1 % — ABNORMAL HIGH (ref 11.5–14.5)
WBC: 7.1 10*3/uL (ref 3.6–11.0)

## 2017-08-12 MED ORDER — DEXTROSE 5 % IV SOLN
1.0000 g | INTRAVENOUS | Status: DC
  Administered 2017-08-12 – 2017-08-13 (×2): 1 g via INTRAVENOUS
  Filled 2017-08-12 (×2): qty 10

## 2017-08-12 NOTE — Progress Notes (Signed)
Chaplain rounding unit visit and met pt and son and bedside. Pt was sitting on a reclining chair at the time of this visit. Son said it was a good thing that his mom was able to sit in the chair. [place] told pt that chaplain services were available as needed.   08/12/17 1500  Clinical Encounter Type  Visited With Patient;Patient and family together  Visit Type Initial;Spiritual support;Other (Comment)  Referral From Chaplain  Consult/Referral To Chaplain  Spiritual Encounters  Spiritual Needs Other (Comment)

## 2017-08-12 NOTE — Evaluation (Signed)
Physical Therapy Evaluation Patient Details Name: Samantha Bowen MRN: [id number] DOB: 10/03/1925 Today's Date: 08/12/2017   History of Present Illness  Pt is an 83 y.o. female presenting to ED initially 08/10/17 s/p fall (pt walking to bathroom with subsequent pain to R shoulder , R low back and hip pain) and returned to ED 08/11/17 d/t unable to stand.  Imaging showing acute mildly displaced fx inferior pubic ramus near symphysis and nondisplaced buckle fx R inferior pubic ramus near ischial tuberosity; also acute minimally displaced fx R sacral ala.  PMH includes htn, h/o breast CA s/p lumpectomy and radiation.  Clinical Impression  Prior to hospital admission, pt was ambulatory with RW.  Pt lives at [facility].  Currently pt is mod assist supine to sit; min to mod assist with standing and transfers with RW; and min to mod assist to walk a few feet with RW bed to recliner.  Ambulation limited d/t R hip/pelvic pain with mobility.  Pt would benefit from skilled PT to address noted impairments and functional limitations (see below for any additional details).  Upon hospital discharge, recommend pt discharge to [place].    Follow Up Recommendations SNF    Equipment Recommendations  Rolling walker with 5" wheels    Recommendations for Other Services       Precautions / Restrictions Precautions Precautions: Fall Restrictions Weight Bearing Restrictions: Yes RLE Weight Bearing: Weight bearing as tolerated Other Position/Activity Restrictions: WBAT with a walker; WBAT R side      Mobility  Bed Mobility Overal bed mobility: Needs Assistance Bed Mobility: Supine to Sit     Supine to sit: Mod assist;HOB elevated     General bed mobility comments: assist for trunk and R LE supine to sit; increased effort and time to perform d/t c/o R pelvic pain  Transfers Overall transfer level: Needs assistance Equipment used: Rolling walker (2 wheeled) Transfers: Sit to/from [employer] Sit to Stand: Min assist;Mod assist Stand pivot transfers: Mod assist(stand pivot to R (using RW) recliner to [facility] and to L (no AD; WB'ing onto L LE towards chair) BSC to chair)       General transfer comment: assist to initiate stand from bed (x3 trials) and from recliner (x1 trial) and from [facility] (x1 trial); vc's for UE and LE positioning and walker use; assist to prevent R LE from slipping forward on floor when standing  Ambulation/Gait Ambulation/Gait assistance: Min assist;Mod assist Ambulation Distance (Feet): 3 Feet Assistive device: Rolling walker (2 wheeled)   Gait velocity: decreased   General Gait Details: decreased stance time R LE (causing decreased L LE step length); vc's to increase UE support through RW; antalgic; vc's for walker use  Stairs            Wheelchair Mobility    Modified Rankin (Stroke Patients Only)       Balance Overall balance assessment: Needs assistance;History of Falls Sitting-balance support: Bilateral upper extremity supported;Feet supported Sitting balance-Leahy Scale: Poor Sitting balance - Comments: requires UE support to maintain static sitting balance   Standing balance support: Bilateral upper extremity supported(on RW) Standing balance-Leahy Scale: Poor Standing balance comment: pt with initial posterior lean and requiring UE support on RW to maintain static standing balance                             Pertinent Vitals/Pain Pain Assessment: Faces Faces Pain Scale: Hurts a little bit(2/10 at rest; 6/10  with activity) Pain Location: R pelvis Pain Descriptors / Indicators: Sharp;Shooting;Sore Pain Intervention(s): Limited activity within patient's tolerance;Monitored during session;Repositioned  Vitals (HR and O2 on room air) stable and WFL throughout treatment session.    Home Living Family/patient expects to be discharged to:: Assisted living               Home Equipment: Gilford Rile - 2  wheels Additional Comments: Lives at [facility]    Prior Function Level of Independence: Needs assistance   Gait / Transfers Assistance Needed: Ambulates with RW modified independent to bathroom; sometimes has assist ambulating to dining hall  ADL's / Homemaking Assistance Needed: Assist for medication and meals; does own sponge bath  Comments: Pt's son reports 2 falls in past 6 months.     Hand Dominance        Extremity/Trunk Assessment   Upper Extremity Assessment Upper Extremity Assessment: Generalized weakness    Lower Extremity Assessment Lower Extremity Assessment: (L LE WFL; R LE able to perform AROM DF/PF, supine heelslides (with increased effort and pain), and sitting knee flexion/extension AROM)    Cervical / Trunk Assessment Cervical / Trunk Assessment: Normal  Communication   Communication: HOH(B hearing aides)  Cognition Arousal/Alertness: Awake/alert Behavior During Therapy: WFL for tasks assessed/performed Overall Cognitive Status: (Oriented to self)                                        General Comments General comments (skin integrity, edema, etc.): Pt resting in bed with pt's son present upon PT entry.  Nursing cleared pt for participation in physical therapy.  Pt agreeable to PT session.    Exercises  Transfer training   Assessment/Plan    PT Assessment Patient needs continued PT services  PT Problem List Decreased strength;Decreased activity tolerance;Decreased balance;Decreased mobility;Decreased knowledge of use of DME;Decreased knowledge of precautions;Pain       PT Treatment Interventions DME instruction;Gait training;Functional mobility training;Therapeutic activities;Therapeutic exercise;Balance training;Patient/family education    PT Goals (Current goals can be found in the Care Plan section)  Acute Rehab PT Goals Patient Stated Goal: to have less pain PT Goal Formulation: With patient Time For Goal Achievement:  08/26/17 Potential to Achieve Goals: Good    Frequency 7X/week   Barriers to discharge Decreased caregiver support      Co-evaluation               AM-PAC PT "6 Clicks" Daily Activity  Outcome Measure Difficulty turning over in bed (including adjusting bedclothes, sheets and blankets)?: Unable Difficulty moving from lying on back to sitting on the side of the bed? : Unable Difficulty sitting down on and standing up from a chair with arms (e.g., wheelchair, bedside commode, etc,.)?: Unable Help needed moving to and from a bed to chair (including a wheelchair)?: A Lot Help needed walking in hospital room?: Total Help needed climbing 3-5 steps with a railing? : Total 6 Click Score: 7    End of Session Equipment Utilized During Treatment: Gait belt Activity Tolerance: Patient limited by pain Patient left: in chair;with call bell/phone within reach;with chair alarm set;with family/visitor present Nurse Communication: Mobility status;Precautions;Weight bearing status PT Visit Diagnosis: Other abnormalities of gait and mobility (R26.89);Muscle weakness (generalized) (M62.81);History of falling (Z91.81);Pain Pain - Right/Left: Right Pain - part of body: Hip    Time: 1435-1511 PT Time Calculation (min) (ACUTE ONLY): 36 min   Charges:  PT Evaluation $PT Eval Low Complexity: 1 Low PT Treatments $Therapeutic Activity: 23-37 mins   PT G Codes:   PT G-Codes **NOT FOR INPATIENT CLASS** Functional Assessment Tool Used: AM-PAC 6 Clicks Basic Mobility Functional Limitation: Mobility: Walking and moving around Mobility: Walking and Moving Around Current Status (K9574): At least 80 percent but less than 100 percent impaired, limited or restricted Mobility: Walking and Moving Around Goal Status [phone]): At least 1 percent but less than 20 percent impaired, limited or restricted    Leitha Bleak, PT 08/12/17, 4:24 PM [phone]

## 2017-08-12 NOTE — Progress Notes (Signed)
Plan is for patient to D/C to [facility] tomorrow. Patient's daughter Freda Munro is aware of above. [facility] admissions coordinator at [facility] is aware of above. Clinical Social Worker (CSW) will continue to follow and assist as needed.   [employer], LCSW [phone]

## 2017-08-12 NOTE — Progress Notes (Signed)
[name] at [place] NAME: Samantha Bowen    MR#:  [id number]  DATE OF BIRTH:  [birth year]  SUBJECTIVE:  CHIEF COMPLAINT:   Chief Complaint  Patient presents with  . Fall  Not able to get much history due to her dementia REVIEW OF SYSTEMS:  Review of Systems  Unable to perform ROS: Dementia    DRUG ALLERGIES:   Allergies  Allergen Reactions  . Cephalexin Other (See Comments)  . Ciprofloxacin Other (See Comments)  . Hydrocodone-Chlorpheniramine Other (See Comments)    Caused excessive sleepiness  . Metronidazole Other (See Comments)  . Sulfa Antibiotics Other (See Comments)  . Sulfamethoxazole-Trimethoprim     Other reaction(s): Unknown  . Tetracyclines & Related Other (See Comments)  . Tussionex Pennkinetic Er [Hydrocod Polst-Cpm Polst Er] Other (See Comments)   VITALS:  Blood pressure 131/66, pulse (!) 104, temperature (!) 97.5 F (36.4 C), temperature source Oral, resp. rate 20, height 5\' 2"  (1.575 m), weight 59 kg (130 lb), SpO2 95 %. PHYSICAL EXAMINATION:  Physical Exam  Constitutional: She is well-developed, well-nourished, and in no distress.  HENT:  Head: Normocephalic and atraumatic.  Eyes: Conjunctivae and EOM are normal. Pupils are equal, round, and reactive to light.  Neck: Normal range of motion. Neck supple. No tracheal deviation present. No thyromegaly present.  Cardiovascular: Normal rate, regular rhythm and normal heart sounds.  Pulmonary/Chest: Effort normal and breath sounds normal. No respiratory distress. She has no wheezes. She exhibits no tenderness.  Abdominal: Soft. Bowel sounds are normal. She exhibits no distension. There is no tenderness.  Musculoskeletal:       Right hip: She exhibits decreased range of motion, decreased strength and tenderness.  Pain at right lateral hip and buttock.  Pain with movement of both legs into pelvis  Neurological: She is alert. She is disoriented. No cranial  nerve deficit.  Skin: Skin is warm and dry. No rash noted.  Psychiatric: Mood and affect normal.   LABORATORY PANEL:  Female CBC Recent Labs  Lab 08/12/17 0347  WBC 7.1  HGB 12.4  HCT 36.8  PLT 179   ------------------------------------------------------------------------------------------------------------------ Chemistries  Recent Labs  Lab 08/10/17 0631  08/12/17 0347  NA 138   < > 136  K 4.2   < > 4.7  CL 105   < > 101  CO2 27   < > 24  GLUCOSE 101*   < > 83  BUN 34*   < > 31*  CREATININE 1.31*   < > 1.33*  CALCIUM 10.3   < > 9.8  AST 40  --   --   ALT 35  --   --   ALKPHOS 111  --   --   BILITOT 0.8  --   --    < > = values in this interval not displayed.   RADIOLOGY:  Ct Pelvis Wo Contrast  Result Date: 08/11/2017 CLINICAL DATA:  83 y/o  F; fall with right hip pain. EXAM: CT PELVIS WITHOUT CONTRAST TECHNIQUE: Multidetector CT imaging of the pelvis was performed following the standard protocol without intravenous contrast. COMPARISON:  None. FINDINGS: Urinary Tract:  No abnormality visualized. Bowel:  Sigmoid diverticulosis. Vascular/Lymphatic: Calcific atherosclerosis of the iliofemoral arteries. Reproductive: No mass or other significant abnormality. Hysterectomy. Other:  None. Musculoskeletal: Acute mildly displaced fracture of inferior pubic ramus near symphysis (series 2, image 94). Acute nondisplaced mild fracture of left inferior pubic ramus  near the ischial tuberosity (series 2, image 100). Minimally displaced acute fracture right sacral ala (series 2, image 36). IMPRESSION: 1. Acute mildly displaced fracture of inferior pubic ramus near symphysis and nondisplaced buckle fracture right inferior pubic ramus near ischial tuberosity. 2. Acute minimally displaced fracture right sacral ala. Electronically Signed   By: Kristine Garbe M.D.   On: 08/11/2017 14:19   ASSESSMENT AND PLAN:  83 y.o. female admitted for inferior right pubic rami fracture and a right  sacral fracture status post fall  1.  Sacral fracture and inferior right pubic rami fracture.  Pain control with Tylenol for mild pain, tramadol for moderate pain and morphine for severe pain.  Social work Therapist, sports up for [employer] but needs physical therapy evaluation before that per Education officer, museum. - Ortho c/s 2.  Acute cystitis without hematuria.  Urine culture growing gram-negative rods, this is likely E. coli based on her previous urine culture sensitivity.  Will start IV Rocephin in spite of it showing allergy looking at her previous medical records she did receive Rocephin for her pneumonia back in April 2017 without any issues. 3.  Essential hypertension continue amlodipine and enalapril 4.  Chronic kidney disease stage III: At baseline 5.  History of gout on allopurinol 6.  Hyperlipidemia unspecified on Lipitor 7.  Hypothyroidism unspecified on levothyroxine 8.  History of dementia 9.  History of breast cancer status post radiation therapy and lumpectomy in the past.       All the records are reviewed and case discussed with Care Management/Social Worker. Management plans discussed with the patient, family (discussed with her daughter via phone) and they are in agreement.  CODE STATUS: DNR  TOTAL TIME TAKING CARE OF THIS PATIENT: 25 minutes.   More than 50% of the time was spent in counseling/coordination of care: YES  POSSIBLE D/C IN 1-2 DAYS, DEPENDING ON CLINICAL CONDITION.  An orthopedic evaluation along with palliative care   Max Sane M.D on 08/12/2017 at 8:33 AM  Between 7am to 6pm - Pager - [phone]  After 6pm go to [email or web address] - Proofreader  [facility]  Office  [phone]  CC: Primary care physician; Dion Body, MD  Note: This dictation was prepared with Dragon dictation along with smaller phrase technology. Any transcriptional errors that result from this process are unintentional.

## 2017-08-12 NOTE — Progress Notes (Signed)
New referral for out patient Palliative to follow at [facility] received from [place]. Plan is for discharge tomorrow. Patient information faxed to referral. Flo Shanks RN, BSN, [facility], hospital liaison [phone] c

## 2017-08-12 NOTE — Consult Note (Signed)
Consultation Note Date: 08/12/2017   Patient Name: Samantha Bowen  DOB: April 11, 1926  MRN: [id number]  Age / Sex: 83 y.o., female  PCP: Dion Body, MD Referring Physician: Max Sane, MD  Reason for Consultation: Establishing goals of care  HPI/Patient Profile: 83 y.o. female   admitted on 08/11/2017.   After a fall at her AL on 08-11-17 she presented to the ER , after evaluation she was sent home.  However she continued with back pain, was unable to walk and her family brought her back to the ER.   A CT scan today showed a inferior right pubic rami fracture and a right sacral fracture.  She was admitted.  Family now faces treatment option decisions and anticipatory care needs.   Clinical Assessment and Goals of Care:  This NP Wadie Lessen reviewed medical records, received report from team, assessed the patient and then meet at the patient's bedside along with her daughter/HPOA/ [facility]  to discuss diagnosis, GOC,  disposition and options.  A  discussion was had today regarding advanced directives.    Values and goals of care important to patient and family were attempted to be elicited.  Patient and family are hopeful for transition to skilled nursing facility for rehab.  All are helpful for return to baseline, to her assisted living where she was thriving prior to this fall.  MOST form was introduced, Concepts specific to code status, artifical feeding and hydration, continued IV antibiotics and rehospitalization was had.  The difference between a aggressive medical intervention path  and a palliative comfort care path was had.  Concept of Palliative Care was discussed.   Questions and concerns addressed.   Family encouraged to call with questions or concerns.  PMT will continue to support holistically.   SUMMARY OF RECOMMENDATIONS    Code Status/Advance Care  Planning:  DNR   Symptom Management:  Patient does no like to utilize pharmaceutical for pain, she states "very sensitive"   Back pain: K-pad and discussed use of Lidoderm patch  Continue with Tylenol  Palliative Prophylaxis:   Aspiration, Bowel Regimen and Frequent Pain Assessment  Additional Recommendations (Limitations, Scope, Preferences):  Full Scope Treatment  Psycho-social/Spiritual:   Desire for further Chaplaincy support:no   Prognosis:   Unable to determine  Discharge Planning: [place] for rehab with Palliative care service follow-up      Primary Diagnoses: Present on Admission: . Sacral fracture ([place])   I have reviewed the medical record, interviewed the patient and family, and examined the patient. The following aspects are pertinent.  Past Medical History:  Diagnosis Date  . Breast cancer ([place]) 09/15/2008   lumpectomy with Radiation  . Cancer [facility])    Breast Cancer  . Gout   . High cholesterol   . Hypertension   . Renal disorder   . Thyroid disease    Social History   Socioeconomic History  . Marital status: Married    Spouse name: None  . Number of children: None  . Years of  education: None  . Highest education level: None  Social Needs  . Financial resource strain: None  . Food insecurity - worry: None  . Food insecurity - inability: None  . Transportation needs - medical: None  . Transportation needs - non-medical: None  Occupational History  . None  Tobacco Use  . Smoking status: Never Smoker  . Smokeless tobacco: Never Used  Substance and Sexual Activity  . Alcohol use: No  . Drug use: No  . Sexual activity: None  Other Topics Concern  . None  Social History Narrative  . None   Family History  Problem Relation Age of Onset  . CVA Mother   . Breast cancer Neg Hx    Scheduled Meds: . allopurinol  100 mg Oral Daily  . amLODipine  5 mg Oral Daily  . atorvastatin  20 mg Oral q1800  . enalapril  20  mg Oral BID  . enoxaparin (LOVENOX) injection  30 mg Subcutaneous Q24H  . levothyroxine  112 mcg Oral QAC breakfast   Continuous Infusions: . cefTRIAXone (ROCEPHIN)  IV Stopped (08/12/17 1124)   PRN Meds:.acetaminophen **OR** acetaminophen, morphine injection, pantoprazole, traMADol Medications Prior to Admission:  Prior to Admission medications   Medication Sig Start Date End Date Taking? Authorizing Provider  acetaminophen (TYLENOL) 500 MG tablet Take 500 mg by mouth every 6 (six) hours as needed for mild pain, moderate pain or fever.    Yes [provider]  allopurinol (ZYLOPRIM) 100 MG tablet Take 100 mg by mouth daily. 11/15/15  Yes [provider]  amLODipine (NORVASC) 5 MG tablet Take 5 mg by mouth daily.  03/24/15  Yes [provider]  atorvastatin (LIPITOR) 20 MG tablet Take 20 mg by mouth daily at 6 PM.   Yes [provider]  enalapril (VASOTEC) 20 MG tablet Take 20 mg by mouth 2 (two) times daily.  04/13/15  Yes [provider]  levothyroxine (SYNTHROID, LEVOTHROID) 112 MCG tablet Take 112 mcg by mouth daily before breakfast.   Yes [provider]  omeprazole (PRILOSEC OTC) 20 MG tablet Take 20 mg by mouth daily as needed. For heartburn/indigestion.   Yes [provider]   Allergies  Allergen Reactions  . Cephalexin Other (See Comments)  . Ciprofloxacin Other (See Comments)  . Hydrocodone-Chlorpheniramine Other (See Comments)    Caused excessive sleepiness  . Metronidazole Other (See Comments)  . Sulfa Antibiotics Other (See Comments)  . Sulfamethoxazole-Trimethoprim     Other reaction(s): Unknown  . Tetracyclines & Related Other (See Comments)  . Tussionex Pennkinetic Er [Hydrocod Polst-Cpm Polst Er] Other (See Comments)   Review of Systems  Musculoskeletal: Positive for back pain.  Neurological: Positive for weakness.    Physical Exam  Constitutional: She is oriented to person, place, and time.  -frail,  elderly   Cardiovascular: Normal rate, regular rhythm and normal heart sounds.  Pulmonary/Chest: She has decreased breath sounds.  Musculoskeletal:  generalized weakness  Neurological: She is alert and oriented to person, place, and time.  Skin: Skin is warm and dry.    Vital Signs: BP 122/62 (BP Location: Left Arm)   Pulse 94   Temp 97.7 F (36.5 C) (Oral)   Resp 20   Ht 5\' 2"  (1.575 m)   Wt 59 kg (130 lb)   SpO2 95%   BMI 23.78 kg/m  Pain Assessment: 0-10 POSS *See Group Information*: 1-Acceptable,Awake and alert Pain Score: Asleep   SpO2: SpO2: 95 % O2 Device:SpO2: 95 % O2 Flow  Rate: .   IO: Intake/output summary:   Intake/Output Summary (Last 24 hours) at 08/12/2017 1400 Last data filed at 08/12/2017 1031 Gross per 24 hour  Intake 770 ml  Output -  Net 770 ml    LBM: Last BM Date: 08/11/17 Baseline Weight: Weight: 59 kg (130 lb) Most recent weight: Weight: 59 kg (130 lb)     Palliative Assessment/Data:   Discussed with Dr Manuella Ghazi  Time In: 1100 Time Out: 1200 Time Total: 60 minutes Greater than 50%  of this time was spent counseling and coordinating care related to the above assessment and plan.  Signed by: Wadie Lessen, NP   Please contact Palliative Medicine Team phone at [phone] for questions and concerns.  For individual provider: See Shea Evans

## 2017-08-12 NOTE — Care Management Obs Status (Signed)
[place] NOTIFICATION   Patient Details  Name: Samantha Bowen MRN: [id number] Date of Birth: 1926-07-11   Medicare Observation Status Notification Given:  Yes    Jolly Mango, RN 08/12/2017, 11:10 AM

## 2017-08-12 NOTE — Progress Notes (Signed)
PT Cancellation Note  Patient Details Name: BEULA JOYNER MRN: [id number] DOB: 05-12-1926   Cancelled Treatment:    Reason Eval/Treat Not Completed: Other (comment).  PT consult received.  Chart reviewed.  Pt with ortho c/s pending.  Will hold PT at this time until ortho consult is completed and any modifications to POC is known.  Leitha Bleak, PT 08/12/17, 9:47 AM [phone]

## 2017-08-12 NOTE — Consult Note (Signed)
ORTHOPAEDIC CONSULTATION  REQUESTING PHYSICIAN: Max Sane, MD  Chief Complaint: Right pelvic pain  HPI: Samantha Bowen is a 83 y.o. female who complains of right pelvic pain after a fall Saturday, 08/10/2017.  She stays at [facility].  She had a fall going to the bathroom and was seen at the [facility] emergency room 08/10/2017.  X-rays at that time did not reveal any fractures.  Because of inability to weight-bear on the right side she was brought back to the emergency room yesterday.  A CT scan of the pelvis revealed nondisplaced fractures of the right side of the pelvis and sacrum.  She was admitted for medical management and therapy.  Past Medical History:  Diagnosis Date  . Breast cancer ([place]) 09/15/2008   lumpectomy with Radiation  . Cancer [facility])    Breast Cancer  . Gout   . High cholesterol   . Hypertension   . Renal disorder   . Thyroid disease    Past Surgical History:  Procedure Laterality Date  . ABDOMINAL HYSTERECTOMY    . BREAST BIOPSY Left 2009   stereo Bx   +  . BREAST LUMPECTOMY Left 2009   with radiation   Social History   Socioeconomic History  . Marital status: Married    Spouse name: None  . Number of children: None  . Years of education: None  . Highest education level: None  Social Needs  . Financial resource strain: None  . Food insecurity - worry: None  . Food insecurity - inability: None  . Transportation needs - medical: None  . Transportation needs - non-medical: None  Occupational History  . None  Tobacco Use  . Smoking status: Never Smoker  . Smokeless tobacco: Never Used  Substance and Sexual Activity  . Alcohol use: No  . Drug use: No  . Sexual activity: None  Other Topics Concern  . None  Social History Narrative  . None   Family History  Problem Relation Age of Onset  . CVA Mother   . Breast cancer Neg Hx    Allergies  Allergen Reactions  . Cephalexin Other (See Comments)  . Ciprofloxacin Other (See  Comments)  . Hydrocodone-Chlorpheniramine Other (See Comments)    Caused excessive sleepiness  . Metronidazole Other (See Comments)  . Sulfa Antibiotics Other (See Comments)  . Sulfamethoxazole-Trimethoprim     Other reaction(s): Unknown  . Tetracyclines & Related Other (See Comments)  . Tussionex Pennkinetic Er [Hydrocod Polst-Cpm Polst Er] Other (See Comments)   Prior to Admission medications   Medication Sig Start Date End Date Taking? Authorizing Provider  acetaminophen (TYLENOL) 500 MG tablet Take 500 mg by mouth every 6 (six) hours as needed for mild pain, moderate pain or fever.    Yes [provider]  allopurinol (ZYLOPRIM) 100 MG tablet Take 100 mg by mouth daily. 11/15/15  Yes [provider]  amLODipine (NORVASC) 5 MG tablet Take 5 mg by mouth daily.  03/24/15  Yes [provider]  atorvastatin (LIPITOR) 20 MG tablet Take 20 mg by mouth daily at 6 PM.   Yes [provider]  enalapril (VASOTEC) 20 MG tablet Take 20 mg by mouth 2 (two) times daily.  04/13/15  Yes [provider]  levothyroxine (SYNTHROID, LEVOTHROID) 112 MCG tablet Take 112 mcg by mouth daily before breakfast.   Yes [provider]  omeprazole (PRILOSEC OTC) 20 MG tablet Take 20 mg by mouth daily as needed. For heartburn/indigestion.   Yes  [provider]   Ct Pelvis Wo Contrast  Result Date: 08/11/2017 CLINICAL DATA:  83 y/o  F; fall with right hip pain. EXAM: CT PELVIS WITHOUT CONTRAST TECHNIQUE: Multidetector CT imaging of the pelvis was performed following the standard protocol without intravenous contrast. COMPARISON:  None. FINDINGS: Urinary Tract:  No abnormality visualized. Bowel:  Sigmoid diverticulosis. Vascular/Lymphatic: Calcific atherosclerosis of the iliofemoral arteries. Reproductive: No mass or other significant abnormality. Hysterectomy. Other:  None. Musculoskeletal: Acute mildly displaced fracture of inferior pubic ramus near symphysis  (series 2, image 94). Acute nondisplaced mild fracture of left inferior pubic ramus near the ischial tuberosity (series 2, image 100). Minimally displaced acute fracture right sacral ala (series 2, image 36). IMPRESSION: 1. Acute mildly displaced fracture of inferior pubic ramus near symphysis and nondisplaced buckle fracture right inferior pubic ramus near ischial tuberosity. 2. Acute minimally displaced fracture right sacral ala. Electronically Signed   By: Kristine Garbe M.D.   On: 08/11/2017 14:19    Positive ROS: All other systems have been reviewed and were otherwise negative with the exception of those mentioned in the HPI and as above.  Physical Exam: General: Alert, no acute distress Cardiovascular: No pedal edema Respiratory: No cyanosis, no use of accessory musculature GI: No organomegaly, abdomen is soft and non-tender Skin: No lesions in the area of chief complaint Neurologic: Sensation intact distally Psychiatric: Patient is competent for consent with normal mood and affect Lymphatic: No axillary or cervical lymphadenopathy  MUSCULOSKELETAL: The patient is alert and cooperative.  She has good motion of the right hip without much pain.  She is tender over the sacrum and the anterior pubis.  There is no bruising.  Neurovascular status is good.  Left side is unremarkable.  Assessment: Nondisplaced fractures of the right sacrum and pubis  Plan: Symptomatic care with a walker and PT. She may weight-bear as tolerated on the right.  She is severely osteopenic.  She should use aspirin for anticoagulation. She should return to my office for evaluation and x-rays in 2-3 weeks.    Park Breed, MD [phone]   08/12/2017 1:52 PM

## 2017-08-12 NOTE — Clinical Social Work Placement (Signed)
   CLINICAL SOCIAL WORK PLACEMENT  NOTE  Date:  08/12/2017  Patient Details  Name: ANGELA VAZGUEZ MRN: [id number] Date of Birth: 09/17/1926  Clinical Social Work is seeking post-discharge placement for this patient at the [place] level of care (*CSW will initial, date and re-position this form in  chart as items are completed):  Yes   Patient/family provided with [place] Work Department's list of facilities offering this level of care within the geographic area requested by the patient (or if unable, by the patient's family).  Yes   Patient/family informed of their freedom to choose among providers that offer the needed level of care, that participate in Medicare, Medicaid or managed care program needed by the patient, have an available bed and are willing to accept the patient.  Yes   Patient/family informed of 's ownership interest in [facility] and [facility], as well as of the fact that they are under no obligation to receive care at these facilities.  PASRR submitted to EDS on 08/11/17     PASRR number received on 08/11/17     Existing PASRR number confirmed on       FL2 transmitted to all facilities in geographic area requested by pt/family on 08/11/17     FL2 transmitted to all facilities within larger geographic area on       Patient informed that his/her managed care company has contracts with or will negotiate with certain facilities, including the following:        Yes   Patient/family informed of bed offers received.  Patient chooses bed at [facility] )     Physician recommends and patient chooses bed at      Patient to be transferred to   on  .  Patient to be transferred to facility by       Patient family notified on   of transfer.  Name of family member notified:        PHYSICIAN       Additional Comment:    _______________________________________________ Johnasia Liese, Veronia Beets,  LCSW 08/12/2017, 4:57 PM

## 2017-08-13 LAB — BASIC METABOLIC PANEL
Anion gap: 8 (ref 5–15)
BUN: 29 mg/dL — ABNORMAL HIGH (ref 6–20)
CALCIUM: 9.9 mg/dL (ref 8.9–10.3)
CHLORIDE: 100 mmol/L — AB (ref 101–111)
CO2: 26 mmol/L (ref 22–32)
CREATININE: 1.39 mg/dL — AB (ref 0.44–1.00)
GFR calc non Af Amer: 33 mL/min — ABNORMAL LOW (ref >60)
GFR, EST AFRICAN AMERICAN: 38 mL/min — AB (ref >60)
GLUCOSE: 90 mg/dL (ref 65–99)
Potassium: 4.5 mmol/L (ref 3.5–5.1)
Sodium: 134 mmol/L — ABNORMAL LOW (ref 135–145)

## 2017-08-13 LAB — CBC
HEMATOCRIT: 37.7 % (ref 35.0–47.0)
HEMOGLOBIN: 12.6 g/dL (ref 12.0–16.0)
MCH: 30.7 pg (ref 26.0–34.0)
MCHC: 33.4 g/dL (ref 32.0–36.0)
MCV: 91.8 fL (ref 80.0–100.0)
Platelets: 191 10*3/uL (ref 150–440)
RBC: 4.11 MIL/uL (ref 3.80–5.20)
RDW: 14.6 % — AB (ref 11.5–14.5)
WBC: 7.7 10*3/uL (ref 3.6–11.0)

## 2017-08-13 MED ORDER — ASPIRIN EC 325 MG PO TBEC: 325.0000 mg | DELAYED_RELEASE_TABLET | Freq: Every day | ORAL | 0 refills | Status: DC

## 2017-08-13 MED ORDER — CEPHALEXIN 250 MG PO CAPS: 250.0000 mg | ORAL_CAPSULE | Freq: Two times a day (BID) | ORAL | 0 refills | Status: AC

## 2017-08-13 MED ORDER — IBUPROFEN 400 MG PO TABS: 400.0000 mg | ORAL_TABLET | Freq: Four times a day (QID) | ORAL | Status: DC | PRN

## 2017-08-13 MED ORDER — TRAMADOL HCL 50 MG PO TABS: 50.0000 mg | ORAL_TABLET | Freq: Two times a day (BID) | ORAL | 0 refills | Status: AC | PRN

## 2017-08-13 NOTE — Progress Notes (Signed)
Report called to [facility] place. Report given to [facility]. EMS called for transport.

## 2017-08-13 NOTE — Progress Notes (Signed)
Patient is medically stable for D/C to [facility] today. Per [facility] admissions coordinator at [facility] patient can come today to room 208-B. RN will call report to [phone] and arrange EMS for transport. Clinical Education officer, museum (CSW) sent D/C orders to [employer] via [employer]. Patient is aware of above. Please reconsult if future social work needs arise. CSW signing off.   [employer], LCSW [phone]

## 2017-08-13 NOTE — Clinical Social Work Placement (Signed)
   CLINICAL SOCIAL WORK PLACEMENT  NOTE  Date:  08/13/2017  Patient Details  Name: Samantha Bowen MRN: [id number] Date of Birth: 1925-09-29  Clinical Social Work is seeking post-discharge placement for this patient at the [place] level of care (*CSW will initial, date and re-position this form in  chart as items are completed):  Yes   Patient/family provided with [place] Work Department's list of facilities offering this level of care within the geographic area requested by the patient (or if unable, by the patient's family).  Yes   Patient/family informed of their freedom to choose among providers that offer the needed level of care, that participate in Medicare, Medicaid or managed care program needed by the patient, have an available bed and are willing to accept the patient.  Yes   Patient/family informed of [place]'s ownership interest in [facility] and [facility], as well as of the fact that they are under no obligation to receive care at these facilities.  PASRR submitted to EDS on 08/11/17     PASRR number received on 08/11/17     Existing PASRR number confirmed on       FL2 transmitted to all facilities in geographic area requested by pt/family on 08/11/17     FL2 transmitted to all facilities within larger geographic area on       Patient informed that his/her managed care company has contracts with or will negotiate with certain facilities, including the following:        Yes   Patient/family informed of bed offers received.  Patient chooses bed at [facility] )     Physician recommends and patient chooses bed at      Patient to be transferred to [facility] ) on 08/13/17.  Patient to be transferred to facility by [facility] )     Patient family notified on 08/13/17 of transfer.  Name of family member notified:  (Patient's daughter Freda Munro is aware of D/C today. )     PHYSICIAN        Additional Comment:    _______________________________________________ Azekiel Cremer, Veronia Beets, LCSW 08/13/2017, 9:16 AM

## 2017-08-13 NOTE — Progress Notes (Signed)
ED CULTURE REPORT   83 yo female seen in ED on 11/24 for reported fall at nursing home. During evaluation, her UA was noted to show many bacteria and patient was given one dose of Fosfomycin 3g. She was discharged but returned on 11/25 with c/o pain and desire for further evaluation at which time she was admitted. During her hospital stay, she was given two doses of Ceftriaxone 1g. On 11/27 (day of discharge) her urine culture reported showing Klebsiella pneumoniae sensitive to Ceftriaxone and cefazolin. Patient was discharged 11/27 with cephalexin 250mg  BID x 3days.  Spoke with ED MD Dr. Corky Downs who agreed that no further action was needed at this time.   Lendon Ka, PharmD Pharmacy Resident

## 2017-08-13 NOTE — Progress Notes (Signed)
EMS here to transfer patient. Vitals signs stable. IV removed

## 2017-08-13 NOTE — Discharge Summary (Signed)
[facility] NAME: Samantha Bowen    MR#:  [id number]  DATE OF BIRTH:  [birth year]  DATE OF ADMISSION:  08/11/2017   ADMITTING PHYSICIAN: Loletha Grayer, MD  DATE OF DISCHARGE: 08/13/2017  PRIMARY CARE PHYSICIAN: Dion Body, MD   ADMISSION DIAGNOSIS:  Closed fracture of left inferior pubic ramus, initial encounter ([place]) [S32.592A] Closed fracture of sacrum, unspecified fracture morphology, initial encounter ([place]) [S32.10XA] DISCHARGE DIAGNOSIS:  Active Problems:   Sacral fracture (HCC)   Closed fracture of left inferior pubic ramus ([place])   DNR (do not resuscitate)   Palliative care by specialist  SECONDARY DIAGNOSIS:   Past Medical History:  Diagnosis Date  . Breast cancer ([place]) 09/15/2008   lumpectomy with Radiation  . Cancer [facility])    Breast Cancer  . Gout   . High cholesterol   . Hypertension   . Renal disorder   . Thyroid disease    HOSPITAL COURSE:  83 y.o.femaleadmitted for inferior right pubic rami fracture and a right sacral fracture status post fall  1. Sacral fracture and inferior right pubic rami fracture: pain control. weight-bear as tolerated on the right 2. Klebsiella UTI: been tolerating Rocehin without any side-effects. Will be D/Ced on Keflex. 3. Essential hypertension continue amlodipine and enalapril 4. Chronic kidney disease stage III: At baseline 5. History of gout on allopurinol 6. Hyperlipidemia unspecified on Lipitor 7. Hypothyroidism unspecified on levothyroxine 8. History of dementia 9. History of breast cancer status post radiation therapy and lumpectomy in the past. DISCHARGE CONDITIONS:  stable CONSULTS OBTAINED:  Treatment Team:  Earnestine Leys, MD DRUG ALLERGIES:   Allergies  Allergen Reactions  . Cephalexin Other (See Comments)  . Ciprofloxacin Other (See Comments)  . Hydrocodone-Chlorpheniramine Other (See Comments)    Caused excessive sleepiness  .  Metronidazole Other (See Comments)  . Sulfa Antibiotics Other (See Comments)  . Sulfamethoxazole-Trimethoprim     Other reaction(s): Unknown  . Tetracyclines & Related Other (See Comments)  . Tussionex Pennkinetic Er [Hydrocod Polst-Cpm Polst Er] Other (See Comments)   DISCHARGE MEDICATIONS:   Allergies as of 08/13/2017      Reactions   Cephalexin Other (See Comments)   Ciprofloxacin Other (See Comments)   Hydrocodone-chlorpheniramine Other (See Comments)   Caused excessive sleepiness   Metronidazole Other (See Comments)   Sulfa Antibiotics Other (See Comments)   Sulfamethoxazole-trimethoprim    Other reaction(s): Unknown   Tetracyclines & Related Other (See Comments)   Tussionex Pennkinetic Er [hydrocod Polst-cpm Polst Er] Other (See Comments)      Medication List    TAKE these medications   acetaminophen 500 MG tablet Commonly known as:  TYLENOL Take 500 mg by mouth every 6 (six) hours as needed for mild pain, moderate pain or fever.   allopurinol 100 MG tablet Commonly known as:  ZYLOPRIM Take 100 mg by mouth daily.   amLODipine 5 MG tablet Commonly known as:  NORVASC Take 5 mg by mouth daily.   aspirin EC 325 MG tablet Take 1 tablet (325 mg total) by mouth daily.   atorvastatin 20 MG tablet Commonly known as:  LIPITOR Take 20 mg by mouth daily at 6 PM.   cephALEXin 250 MG capsule Commonly known as:  KEFLEX Take 1 capsule (250 mg total) by mouth 2 (two) times daily for 3 days.   enalapril 20 MG tablet Commonly known as:  VASOTEC Take 20 mg by mouth 2 (two) times daily.   levothyroxine 112 MCG  tablet Commonly known as:  SYNTHROID, LEVOTHROID Take 112 mcg by mouth daily before breakfast.   omeprazole 20 MG tablet Commonly known as:  PRILOSEC OTC Take 20 mg by mouth daily as needed. For heartburn/indigestion.   traMADol 50 MG tablet Commonly known as:  ULTRAM Take 1 tablet (50 mg total) by mouth every 12 (twelve) hours as needed for moderate pain or  severe pain.        DISCHARGE INSTRUCTIONS:   DIET:  Regular diet DISCHARGE CONDITION:  Good ACTIVITY:  Activity as tolerated OXYGEN:  Home Oxygen: No.  Oxygen Delivery: room air DISCHARGE LOCATION:  nursing home - Palliative care to follow while at the facility   If you experience worsening of your admission symptoms, develop shortness of breath, life threatening emergency, suicidal or homicidal thoughts you must seek medical attention immediately by calling 911 or calling your MD immediately  if symptoms less severe.  You Must read complete instructions/literature along with all the possible adverse reactions/side effects for all the Medicines you take and that have been prescribed to you. Take any new Medicines after you have completely understood and accpet all the possible adverse reactions/side effects.   Please note  You were cared for by a hospitalist during your hospital stay. If you have any questions about your discharge medications or the care you received while you were in the hospital after you are discharged, you can call the unit and asked to speak with the hospitalist on call if the hospitalist that took care of you is not available. Once you are discharged, your primary care physician will handle any further medical issues. Please note that NO REFILLS for any discharge medications will be authorized once you are discharged, as it is imperative that you return to your primary care physician (or establish a relationship with a primary care physician if you do not have one) for your aftercare needs so that they can reassess your need for medications and monitor your lab values.    On the day of Discharge:  VITAL SIGNS:  Blood pressure 133/69, pulse 90, temperature 97.8 F (36.6 C), temperature source Oral, resp. rate 18, height 5\' 2"  (1.575 m), weight 59 kg (130 lb), SpO2 98 %. PHYSICAL EXAMINATION:  GENERAL:  83 y.o.-year-old patient lying in the bed with no acute  distress.  EYES: Pupils equal, round, reactive to light and accommodation. No scleral icterus. Extraocular muscles intact.  HEENT: Head atraumatic, normocephalic. Oropharynx and nasopharynx clear.  NECK:  Supple, no jugular venous distention. No thyroid enlargement, no tenderness.  LUNGS: Normal breath sounds bilaterally, no wheezing, rales,rhonchi or crepitation. No use of accessory muscles of respiration.  CARDIOVASCULAR: S1, S2 normal. No murmurs, rubs, or gallops.  ABDOMEN: Soft, non-tender, non-distended. Bowel sounds present. No organomegaly or mass.  EXTREMITIES: No pedal edema, cyanosis, or clubbing.  NEUROLOGIC: Cranial nerves II through XII are intact. Muscle strength 5/5 in all extremities. Sensation intact. Gait not checked.  PSYCHIATRIC: The patient is alert and oriented x 3.  SKIN: No obvious rash, lesion, or ulcer.  DATA REVIEW:   CBC Recent Labs  Lab 08/13/17 0414  WBC 7.7  HGB 12.6  HCT 37.7  PLT 191    Chemistries  Recent Labs  Lab 08/10/17 0631  08/13/17 0414  NA 138   < > 134*  K 4.2   < > 4.5  CL 105   < > 100*  CO2 27   < > 26  GLUCOSE 101*   < >  90  BUN 34*   < > 29*  CREATININE 1.31*   < > 1.39*  CALCIUM 10.3   < > 9.9  AST 40  --   --   ALT 35  --   --   ALKPHOS 111  --   --   BILITOT 0.8  --   --    < > = values in this interval not displayed.       Management plans discussed with the patient, family and they are in agreement.  CODE STATUS: DNR   TOTAL TIME TAKING CARE OF THIS PATIENT: 45 minutes.    Max Sane M.D on 08/13/2017 at 7:37 AM  Between 7am to 6pm - Pager - [phone]  After 6pm go to [email or web address] - Proofreader  [facility]  Office  [phone]  CC: Primary care physician; Dion Body, MD   Note: This dictation was prepared with Dragon dictation along with smaller phrase technology. Any transcriptional errors that result from this process are unintentional.

## 2017-08-17 ENCOUNTER — Encounter
Admission: RE | Admit: 2017-08-17 | Discharge: 2017-08-17 | Disposition: A | Discharge status: Home / Self Care | Payer: Medicare Other | Source: Ambulatory Visit | Attending: Internal Medicine | Admitting: Internal Medicine

## 2017-08-20 ENCOUNTER — Other Ambulatory Visit
Admission: RE | Admit: 2017-08-20 | Discharge: 2017-08-20 | Disposition: A | Discharge status: Home / Self Care | Payer: Medicare Other | Source: Ambulatory Visit | Attending: Gerontology | Admitting: Gerontology

## 2017-08-20 ENCOUNTER — Non-Acute Institutional Stay (SKILLED_NURSING_FACILITY): Payer: Medicare Other | Admitting: Gerontology

## 2017-08-20 DIAGNOSIS — E871 Hypo-osmolality and hyponatremia: Secondary | ICD-10-CM

## 2017-08-20 DIAGNOSIS — K5900 Constipation, unspecified: Secondary | ICD-10-CM | POA: Diagnosis not present

## 2017-08-20 DIAGNOSIS — R5381 Other malaise: Secondary | ICD-10-CM | POA: Diagnosis present

## 2017-08-20 DIAGNOSIS — E875 Hyperkalemia: Secondary | ICD-10-CM

## 2017-08-20 LAB — URINALYSIS, COMPLETE (UACMP) WITH MICROSCOPIC
BACTERIA UA: NONE SEEN
Bilirubin Urine: NEGATIVE
Glucose, UA: NEGATIVE mg/dL
Hgb urine dipstick: NEGATIVE
KETONES UR: NEGATIVE mg/dL
Leukocytes, UA: NEGATIVE
Nitrite: NEGATIVE
PH: 5 (ref 5.0–8.0)
PROTEIN: NEGATIVE mg/dL
Specific Gravity, Urine: 1.016 (ref 1.005–1.030)

## 2017-08-20 LAB — COMPREHENSIVE METABOLIC PANEL
ALBUMIN: 4 g/dL (ref 3.5–5.0)
ALK PHOS: 133 U/L — AB (ref 38–126)
ALT: 26 U/L (ref 14–54)
ANION GAP: 10 (ref 5–15)
AST: 27 U/L (ref 15–41)
BILIRUBIN TOTAL: 0.5 mg/dL (ref 0.3–1.2)
BUN: 48 mg/dL — AB (ref 6–20)
CHLORIDE: 91 mmol/L — AB (ref 101–111)
CO2: 24 mmol/L (ref 22–32)
CREATININE: 1.51 mg/dL — AB (ref 0.44–1.00)
Calcium: 10.3 mg/dL (ref 8.9–10.3)
GFR calc non Af Amer: 29 mL/min — ABNORMAL LOW (ref >60)
GFR, EST AFRICAN AMERICAN: 34 mL/min — AB (ref >60)
Glucose, Bld: 121 mg/dL — ABNORMAL HIGH (ref 65–99)
POTASSIUM: 5.4 mmol/L — AB (ref 3.5–5.1)
SODIUM: 125 mmol/L — AB (ref 135–145)
Total Protein: 6.8 g/dL (ref 6.5–8.1)

## 2017-08-20 LAB — CBC WITH DIFFERENTIAL/PLATELET
BASOS PCT: 1 %
Basophils Absolute: 0.1 10*3/uL (ref 0–0.1)
EOS ABS: 0.2 10*3/uL (ref 0–0.7)
Eosinophils Relative: 2 %
HCT: 35.4 % (ref 35.0–47.0)
Hemoglobin: 11.9 g/dL — ABNORMAL LOW (ref 12.0–16.0)
LYMPHS ABS: 1.4 10*3/uL (ref 1.0–3.6)
Lymphocytes Relative: 14 %
MCH: 30.9 pg (ref 26.0–34.0)
MCHC: 33.7 g/dL (ref 32.0–36.0)
MCV: 91.6 fL (ref 80.0–100.0)
MONO ABS: 0.6 10*3/uL (ref 0.2–0.9)
MONOS PCT: 7 %
Neutro Abs: 7.4 10*3/uL — ABNORMAL HIGH (ref 1.4–6.5)
Neutrophils Relative %: 76 %
Platelets: 298 10*3/uL (ref 150–440)
RBC: 3.86 MIL/uL (ref 3.80–5.20)
RDW: 14.7 % — AB (ref 11.5–14.5)
WBC: 9.7 10*3/uL (ref 3.6–11.0)

## 2017-08-21 ENCOUNTER — Other Ambulatory Visit
Admission: RE | Admit: 2017-08-21 | Discharge: 2017-08-21 | Disposition: A | Discharge status: Home / Self Care | Payer: Medicare Other | Source: Ambulatory Visit | Attending: Gerontology | Admitting: Gerontology

## 2017-08-21 ENCOUNTER — Encounter: Payer: Self-pay | Admitting: Gerontology

## 2017-08-21 DIAGNOSIS — R5381 Other malaise: Secondary | ICD-10-CM | POA: Insufficient documentation

## 2017-08-21 DIAGNOSIS — R112 Nausea with vomiting, unspecified: Secondary | ICD-10-CM | POA: Insufficient documentation

## 2017-08-21 LAB — BASIC METABOLIC PANEL
ANION GAP: 12 (ref 5–15)
BUN: 39 mg/dL — ABNORMAL HIGH (ref 6–20)
CALCIUM: 10.4 mg/dL — AB (ref 8.9–10.3)
CHLORIDE: 94 mmol/L — AB (ref 101–111)
CO2: 23 mmol/L (ref 22–32)
CREATININE: 1.23 mg/dL — AB (ref 0.44–1.00)
GFR calc non Af Amer: 38 mL/min — ABNORMAL LOW (ref >60)
GFR, EST AFRICAN AMERICAN: 44 mL/min — AB (ref >60)
Glucose, Bld: 51 mg/dL — ABNORMAL LOW (ref 65–99)
Potassium: 5.2 mmol/L — ABNORMAL HIGH (ref 3.5–5.1)
SODIUM: 129 mmol/L — AB (ref 135–145)

## 2017-08-21 NOTE — Progress Notes (Signed)
Location:   [facility] Room Number: 208B Place of Service:  SNF [phone]) Provider:  Toni Arthurs, NP-C  Dion Body, MD  Patient Care Team: Dion Body, MD as PCP - General (Family Medicine)  Extended Emergency Contact Information Primary Emergency Contact: Shotwell,Sheila Address: [address].          [place], [place] [address] [place] Phone: [phone] Mobile Phone: [phone] Relation: Daughter  Code Status:  DNR Goals of care: Advanced Directive information Advanced Directives 08/20/2017  Does Patient Have a Medical Advance Directive? Yes  Type of Advance Directive Out of facility DNR (pink MOST or yellow form)  Does patient want to make changes to medical advance directive? No - Patient declined  Copy of [place] in Chart? -  Would patient like information on creating a medical advance directive? -     Chief Complaint  Patient presents with  . Acute Visit    Recheck patient for vomiting    HPI:  Pt is a 83 y.o. Samantha Bowen seen today for an acute visit for vomiting.  Patient reports she has had intermittent episodes of nausea, vomiting, malaise with headache for several days.  No diarrhea.  However, patient has not reported this to staff but daughter believes that she has reported it.  No documentation to back this up.  CNA reports she saw patient put her finger down her throat to force herself to vomit.  Per CNA, patient stated "she felt she needed to make herself throw up to feel better."  There is partially digested emesis in the basin.  Patient denies chest pain, shortness of breath, fever or chills.  Denies abdominal pain, burning with urination, hematuria, hemoptysis or hematemesis.  Patient is unable to confirm her last BM.  CNA states she had a small one this morning.  Patient appears tired, but not overly lethargic.  Mucous membranes pink and moist.  Patient was alert and oriented, able to  answer questions appropriately, though a little slow to respond at times.  Daughter answered some questions for her.  Patient reports pain with movement, but pain tolerable at rest.  Vital signs stable.  No other complaints.   Past Medical History:  Diagnosis Date  . Borderline diabetes mellitus    A1c 5.9% 12/2013 - diet controlled  . Breast cancer ([place]) 09/15/2008   lumpectomy with Radiation  . Cancer [facility])    Breast Cancer  . Chronic renal failure    Baseline Cr  1.6 - 12/2013 followed by Dr. Holley Raring  . Ductal carcinoma of breast, stage 1 (HCC)    history followed by Dr. Gala Murdoch  . Esophageal reflux   . Gout   . High cholesterol   . Hyperlipidemia    LDL 126 08/2013  . Hypertension   . Hypothyroidism    TSH 2.35 12/2013  . Pneumonia, unspecified organism 09/2015  . Renal disorder   . Thyroid disease    Past Surgical History:  Procedure Laterality Date  . ABDOMINAL HYSTERECTOMY    . BREAST BIOPSY Left 2009   stereo Bx   +  . BREAST LUMPECTOMY Left 2009   with radiation  . COLONOSCOPY  12/08/2009   2 polyps (aged out of further screening per Dr. Vira Agar)    Allergies  Allergen Reactions  . Cephalexin Other (See Comments)  . Ciprofloxacin Other (See Comments)  . Hydrocodone-Chlorpheniramine Other (See Comments)    Caused excessive sleepiness  . Metronidazole Other (See  Comments)  . Sulfa Antibiotics Other (See Comments)  . Sulfamethoxazole-Trimethoprim     Other reaction(s): Unknown  . Tetracyclines & Related Other (See Comments)  . Tussionex Pennkinetic Er [Hydrocod Polst-Cpm Polst Er] Other (See Comments)    Allergies as of 08/20/2017      Reactions   Cephalexin Other (See Comments)   Ciprofloxacin Other (See Comments)   Hydrocodone-chlorpheniramine Other (See Comments)   Caused excessive sleepiness   Metronidazole Other (See Comments)   Sulfa Antibiotics Other (See Comments)   Sulfamethoxazole-trimethoprim    Other reaction(s): Unknown   Tetracyclines  & Related Other (See Comments)   Tussionex Pennkinetic Er [hydrocod Polst-cpm Polst Er] Other (See Comments)      Medication List        Accurate as of 08/20/17 11:59 PM. Always use your most recent med list.          acetaminophen 500 MG tablet Commonly known as:  TYLENOL Take 500 mg by mouth every 6 (six) hours as needed.   allopurinol 100 MG tablet Commonly known as:  ZYLOPRIM Take 100 mg by mouth daily.   amLODipine 5 MG tablet Commonly known as:  NORVASC Take 5 mg by mouth daily.   aspirin 325 MG EC tablet Take 325 mg by mouth 2 (two) times daily.   Cholecalciferol 4000 units Caps Take 1 capsule by mouth daily.   levothyroxine 112 MCG tablet Commonly known as:  SYNTHROID, LEVOTHROID Take 112 mcg by mouth daily before breakfast.   omeprazole 20 MG tablet Commonly known as:  PRILOSEC OTC Take 20 mg by mouth daily as needed. For heartburn/indigestion.   sodium chloride 0.9 % Inject into the vein. Give 250 ml bolus over 1 hour, then continuous at 62m/hr x 48 hours for hyponatremia, hydration   traMADol 50 MG tablet Commonly known as:  ULTRAM Take 1 tablet (50 mg total) by mouth every 12 (twelve) hours as needed for moderate pain or severe pain.       Review of Systems  Constitutional: Positive for fatigue. Negative for activity change, appetite change, chills, diaphoresis and fever.  HENT: Negative for congestion, mouth sores, nosebleeds, postnasal drip, sneezing, sore throat, trouble swallowing and voice change.   Respiratory: Negative for apnea, cough, choking, chest tightness, shortness of breath and wheezing.   Cardiovascular: Negative for chest pain, palpitations and leg swelling.  Gastrointestinal: Positive for constipation, nausea and vomiting. Negative for abdominal distention, abdominal pain and diarrhea.  Genitourinary: Negative for difficulty urinating, dysuria, frequency and urgency.  Musculoskeletal: Positive for arthralgias (typical arthritis) and  gait problem. Negative for back pain and myalgias.  Skin: Negative for color change, pallor, rash and wound.  Neurological: Negative for dizziness, tremors, syncope, speech difficulty, weakness, numbness and headaches.  Psychiatric/Behavioral: Negative for agitation and behavioral problems.  All other systems reviewed and are negative.   Immunization History  Administered Date(s) Administered  . Influenza Split 06/24/2015  . Influenza-Unspecified 06/17/2014, 05/18/2016  . Pneumococcal Conjugate-13 09/17/2013  . Pneumococcal Polysaccharide-23 09/17/2008   Pertinent  Health Maintenance Due  Topic Date Due  . DEXA SCAN  03/18/1993  . PNA vac Low Risk Adult (1 of 2 - PCV13) 03/18/1993  . INFLUENZA VACCINE  04/17/2017   No flowsheet data found. Functional Status Survey:    Vitals:   08/20/17 0946  BP: 104/64  Pulse: 92  Resp: 20  Temp: 98 F (36.7 C)  TempSrc: Oral  SpO2: 95%  Weight: 108 lb (49 kg)  Height: 5' 2"  (1.575 m)  Body mass index is 19.75 kg/m. Physical Exam  Constitutional: She is oriented to person, place, and time. Vital signs are normal. She appears well-developed and well-nourished. She is active and cooperative. She does not appear ill. No distress.  HENT:  Head: Normocephalic and atraumatic.  Mouth/Throat: Uvula is midline, oropharynx is clear and moist and mucous membranes are normal. Mucous membranes are not pale, not dry and not cyanotic.  Eyes: Conjunctivae, EOM and lids are normal. Pupils are equal, round, and reactive to light.  Neck: Trachea normal, normal range of motion and full passive range of motion without pain. Neck supple. No JVD present. No tracheal deviation, no edema and no erythema present. No thyromegaly present.  Cardiovascular: Normal rate, regular rhythm, normal heart sounds, intact distal pulses and normal pulses. Exam reveals no gallop, no distant heart sounds and no friction rub.  No murmur heard. Pulses:      Dorsalis pedis  pulses are 2+ on the right side, and 2+ on the left side.  No edema  Pulmonary/Chest: Effort normal. No accessory muscle usage. No respiratory distress. She has decreased breath sounds in the right lower field and the left lower field. She has no wheezes. She has no rhonchi. She has rales in the right middle field. She exhibits no tenderness.  Abdominal: Soft. Normal appearance and bowel sounds are normal. She exhibits no distension and no ascites. There is no tenderness.  Musculoskeletal: Normal range of motion. She exhibits no edema or tenderness.  Expected osteoarthritis, stiffness; Bilateral Calves soft, supple. Negative Homan's Sign. B- pedal pulses equal  Neurological: She is alert and oriented to person, place, and time. She has normal strength.  Skin: Skin is warm, dry and intact. She is not diaphoretic. No cyanosis. No pallor. Nails show no clubbing.  Psychiatric: She has a normal mood and affect. Her speech is normal and behavior is normal. Judgment and thought content normal. Cognition and memory are normal.  Nursing note and vitals reviewed.   Labs reviewed: Recent Labs    08/13/17 0414 08/20/17 1500 08/21/17 0500  NA 134* 125* 129*  K 4.5 5.4* 5.2*  CL 100* 91* 94*  CO2 26 24 23   GLUCOSE 90 121* 51*  BUN 29* 48* 39*  CREATININE 1.39* 1.51* 1.23*  CALCIUM 9.9 10.3 10.4*   Recent Labs    11/01/16 0022 08/10/17 0631 08/20/17 1500  AST 36 40 27  ALT 30 35 26  ALKPHOS 90 111 133*  BILITOT 0.3 0.8 0.5  PROT 7.0 7.0 6.8  ALBUMIN 3.9 4.1 4.0   Recent Labs    08/10/17 0631 08/11/17 1453 08/12/17 0347 08/13/17 0414 08/20/17 1500  WBC 8.9 9.5 7.1 7.7 9.7  NEUTROABS 7.1* 6.0  --   --  7.4*  HGB 13.1 13.9 12.4 12.6 11.9*  HCT 39.2 41.3 36.8 37.7 35.4  MCV 92.2 92.0 92.5 91.8 91.6  PLT 190 197 179 191 298   No results found for: TSH No results found for: HGBA1C No results found for: CHOL, HDL, LDLCALC, LDLDIRECT, TRIG, CHOLHDL  Significant Diagnostic Results in  last 30 days:  Dg Lumbar Spine Complete  Result Date: 08/10/2017 CLINICAL DATA:  Fall this morning. Low back pain. Initial encounter. EXAM: LUMBAR SPINE - COMPLETE 4+ VIEW COMPARISON:  09/30/2013 FINDINGS: There is no evidence of lumbar spine fracture. Alignment is normal. Stable mild degenerative disc disease, greatest at L3-4. Moderate bilateral lower lumbar facet DJD, mainly at L5-S1. Generalized osteopenia. No focal lytic or sclerotic bone lesions identified.  Aortic atherosclerosis. IMPRESSION: Stable exam. No acute findings. Degenerative spondylosis, as described above. Electronically Signed   By: Earle Gell M.D.   On: 08/10/2017 07:49   Dg Shoulder Right  Result Date: 08/10/2017 CLINICAL DATA:  Fall this morning. Right-sided shoulder pain. Initial encounter. EXAM: RIGHT SHOULDER - 2+ VIEW COMPARISON:  None. FINDINGS: There is no evidence of fracture or dislocation. There is no evidence of arthropathy or other focal bone abnormality. Generalized osteopenia. Soft tissues are unremarkable. IMPRESSION: No acute findings.  Osteopenia. Electronically Signed   By: Earle Gell M.D.   On: 08/10/2017 07:50   Dg Elbow Complete Right  Result Date: 08/10/2017 CLINICAL DATA:  Fall this morning. Right elbow pain. Initial encounter. EXAM: RIGHT ELBOW - COMPLETE 3+ VIEW COMPARISON:  None. FINDINGS: There is no evidence of fracture, dislocation, or joint effusion. Olecranon process bone spur noted. No other significant osseous abnormality identified. Soft tissues are unremarkable. IMPRESSION: No acute findings.  Olecranon process bone spur noted. Electronically Signed   By: Earle Gell M.D.   On: 08/10/2017 07:51   Ct Head Wo Contrast  Result Date: 08/10/2017 CLINICAL DATA:  Fall while walking to bathroom this morning. Initial encounter. EXAM: CT HEAD WITHOUT CONTRAST TECHNIQUE: Contiguous axial images were obtained from the base of the skull through the vertex without intravenous contrast. COMPARISON:   04/11/2017 FINDINGS: Brain: There is no evidence of acute infarct, intracranial hemorrhage, mass, midline shift, or extra-axial fluid collection. 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 Confluent cerebral white matter hypodensities are similar to the prior study and nonspecific but compatible with extensive chronic small vessel ischemic disease. Generalized cerebral atrophy is mild for age. Vascular: Calcified atherosclerosis at the skullbase. No hyperdense vessel. Skull: No fracture or suspicious osseous lesion. Sinuses/Orbits: Mild mucosal thickening in the paranasal sinuses. Clear mastoid air cells. Bilateral cataract extraction. Other: None. IMPRESSION: 1. No evidence of acute intracranial abnormality. 2. Extensive chronic small vessel ischemic disease. Electronically Signed   By: Logan Bores M.D.   On: 08/10/2017 08:30   Ct Pelvis Wo Contrast  Result Date: 08/11/2017 CLINICAL DATA:  83 y/o  F; fall with right hip pain. EXAM: CT PELVIS WITHOUT CONTRAST TECHNIQUE: Multidetector CT imaging of the pelvis was performed following the standard protocol without intravenous contrast. COMPARISON:  None. FINDINGS: Urinary Tract:  No abnormality visualized. Bowel:  Sigmoid diverticulosis. Vascular/Lymphatic: Calcific atherosclerosis of the iliofemoral arteries. Reproductive: No mass or other significant abnormality. Hysterectomy. Other:  None. Musculoskeletal: Acute mildly displaced fracture of inferior pubic ramus near symphysis (series 2, image 94). Acute nondisplaced mild fracture of left inferior pubic ramus near the ischial tuberosity (series 2, image 100). Minimally displaced acute fracture right sacral ala (series 2, image 36). IMPRESSION: 1. Acute mildly displaced fracture of inferior pubic ramus near symphysis and nondisplaced buckle fracture right inferior pubic ramus near ischial tuberosity. 2. Acute minimally displaced fracture right sacral ala. Electronically Signed   By:  Kristine Garbe M.D.   On: 08/11/2017 14:19   Dg Hip Unilat W Or Wo Pelvis 2-3 Views Right  Result Date: 08/10/2017 CLINICAL DATA:  Fall this morning. Right hip pain. Initial encounter. EXAM: DG HIP (WITH OR WITHOUT PELVIS) 2-3V RIGHT COMPARISON:  04/11/2017 FINDINGS: There is no evidence of hip fracture or dislocation. Mild right hip degenerative spurring without significant joint space narrowing. No other bone lesions identified. IMPRESSION: No acute findings. Mild right hip osteoarthritis. Electronically Signed   By: Earle Gell M.D.   On: 08/10/2017  07:46    Assessment/Plan 1.  Hyponatremia  Insert and maintain peripheral IV for infusion of fluids  0.9% normal saline, give 250 mL bolus over 1 hour, then continuous infusion at 75 mL an hour times 48 hours  Encourage use of pain medications if they are needed  Tylenol 500 mg p.o. 4 times daily for pain  Recheck met B in the a.m.  2.  Hyperkalemia  See above for infusion of fluids  Re-check potassium level via Met B in the AM  If potassium level still elevated, implement p.o. Interventions  Lactulose 15 g p.o. twice daily times 1 day  Recheck met B the next morning  DC enalapril  3.  Constipation, unspecified constipation type  Sorbitol 70%-30 mils p.o. every 2 hours until large BM, then may change order to as needed   Family/ staff Communication:   Total Time: 35 minutes  Documentation: 20 minutes  Face to Face: 15 minutes  Family/Phone: Discussion with daughter at bedside    Labs/tests ordered: 2 view chest x-ray, KUB, CBC, met C, UA, C&S  Medication list reviewed and assessed for continued appropriateness.  Vikki Ports, NP-C [facility]iatric[facility] [phone], [place] [address] Cell Phone (Mon-Fri 8am-5pm):  [phone] On Call:  [phone] & follow prompts after 5pm & weekends Office Phone:  [phone] Office Fax:  [phone]

## 2017-08-22 ENCOUNTER — Other Ambulatory Visit
Admission: RE | Admit: 2017-08-22 | Discharge: 2017-08-22 | Disposition: A | Discharge status: Home / Self Care | Payer: Medicare Other | Source: Ambulatory Visit | Attending: Gerontology | Admitting: Gerontology

## 2017-08-22 DIAGNOSIS — R5381 Other malaise: Secondary | ICD-10-CM | POA: Insufficient documentation

## 2017-08-22 DIAGNOSIS — R112 Nausea with vomiting, unspecified: Secondary | ICD-10-CM | POA: Insufficient documentation

## 2017-08-22 LAB — BASIC METABOLIC PANEL
ANION GAP: 10 (ref 5–15)
BUN: 28 mg/dL — ABNORMAL HIGH (ref 6–20)
CALCIUM: 10.4 mg/dL — AB (ref 8.9–10.3)
CO2: 24 mmol/L (ref 22–32)
CREATININE: 1.15 mg/dL — AB (ref 0.44–1.00)
Chloride: 98 mmol/L — ABNORMAL LOW (ref 101–111)
GFR calc non Af Amer: 41 mL/min — ABNORMAL LOW (ref >60)
GFR, EST AFRICAN AMERICAN: 47 mL/min — AB (ref >60)
Glucose, Bld: 81 mg/dL (ref 65–99)
Potassium: 5.3 mmol/L — ABNORMAL HIGH (ref 3.5–5.1)
SODIUM: 132 mmol/L — AB (ref 135–145)

## 2017-08-22 LAB — URINE CULTURE

## 2017-08-23 ENCOUNTER — Other Ambulatory Visit
Admission: RE | Admit: 2017-08-23 | Discharge: 2017-08-23 | Disposition: A | Discharge status: Home / Self Care | Payer: Medicare Other | Source: Ambulatory Visit | Attending: Gerontology | Admitting: Gerontology

## 2017-08-23 DIAGNOSIS — R112 Nausea with vomiting, unspecified: Secondary | ICD-10-CM | POA: Insufficient documentation

## 2017-08-23 DIAGNOSIS — R5381 Other malaise: Secondary | ICD-10-CM | POA: Diagnosis present

## 2017-08-23 LAB — BASIC METABOLIC PANEL WITH GFR
Anion gap: 9 (ref 5–15)
BUN: 25 mg/dL — ABNORMAL HIGH (ref 6–20)
CO2: 23 mmol/L (ref 22–32)
Calcium: 10.1 mg/dL (ref 8.9–10.3)
Chloride: 100 mmol/L — ABNORMAL LOW (ref 101–111)
Creatinine, Ser: 1.12 mg/dL — ABNORMAL HIGH (ref 0.44–1.00)
GFR calc Af Amer: 49 mL/min — ABNORMAL LOW
GFR calc non Af Amer: 42 mL/min — ABNORMAL LOW
Glucose, Bld: 81 mg/dL (ref 65–99)
Potassium: 4.7 mmol/L (ref 3.5–5.1)
Sodium: 132 mmol/L — ABNORMAL LOW (ref 135–145)

## 2017-08-24 ENCOUNTER — Other Ambulatory Visit
Admission: RE | Admit: 2017-08-24 | Discharge: 2017-08-24 | Disposition: A | Discharge status: Home / Self Care | Payer: Medicare Other | Source: Skilled Nursing Facility | Attending: Gerontology | Admitting: Gerontology

## 2017-08-24 DIAGNOSIS — R5381 Other malaise: Secondary | ICD-10-CM | POA: Insufficient documentation

## 2017-08-24 LAB — BASIC METABOLIC PANEL
Anion gap: 8 (ref 5–15)
BUN: 22 mg/dL — ABNORMAL HIGH (ref 6–20)
CHLORIDE: 102 mmol/L (ref 101–111)
CO2: 23 mmol/L (ref 22–32)
CREATININE: 1.1 mg/dL — AB (ref 0.44–1.00)
Calcium: 9.8 mg/dL (ref 8.9–10.3)
GFR calc non Af Amer: 43 mL/min — ABNORMAL LOW (ref >60)
GFR, EST AFRICAN AMERICAN: 50 mL/min — AB (ref >60)
Glucose, Bld: 72 mg/dL (ref 65–99)
Potassium: 4.5 mmol/L (ref 3.5–5.1)
Sodium: 133 mmol/L — ABNORMAL LOW (ref 135–145)

## 2017-08-27 ENCOUNTER — Other Ambulatory Visit
Admission: RE | Admit: 2017-08-27 | Discharge: 2017-08-27 | Disposition: A | Discharge status: Home / Self Care | Payer: Medicare Other | Source: Ambulatory Visit | Attending: Gerontology | Admitting: Gerontology

## 2017-08-27 DIAGNOSIS — R410 Disorientation, unspecified: Secondary | ICD-10-CM | POA: Diagnosis present

## 2017-08-27 LAB — COMPREHENSIVE METABOLIC PANEL
ALK PHOS: 257 U/L — AB (ref 38–126)
ALT: 24 U/L (ref 14–54)
ANION GAP: 11 (ref 5–15)
AST: 34 U/L (ref 15–41)
Albumin: 3.8 g/dL (ref 3.5–5.0)
BUN: 25 mg/dL — ABNORMAL HIGH (ref 6–20)
CALCIUM: 9.6 mg/dL (ref 8.9–10.3)
CHLORIDE: 92 mmol/L — AB (ref 101–111)
CO2: 22 mmol/L (ref 22–32)
CREATININE: 1.39 mg/dL — AB (ref 0.44–1.00)
GFR, EST AFRICAN AMERICAN: 38 mL/min — AB (ref >60)
GFR, EST NON AFRICAN AMERICAN: 33 mL/min — AB (ref >60)
Glucose, Bld: 132 mg/dL — ABNORMAL HIGH (ref 65–99)
Potassium: 4.3 mmol/L (ref 3.5–5.1)
Sodium: 125 mmol/L — ABNORMAL LOW (ref 135–145)
Total Bilirubin: 0.7 mg/dL (ref 0.3–1.2)
Total Protein: 6.5 g/dL (ref 6.5–8.1)

## 2017-08-27 LAB — CBC WITH DIFFERENTIAL/PLATELET
Basophils Absolute: 0.1 10*3/uL (ref 0–0.1)
Basophils Relative: 1 %
EOS ABS: 0.2 10*3/uL (ref 0–0.7)
EOS PCT: 2 %
HCT: 36.3 % (ref 35.0–47.0)
Hemoglobin: 12.2 g/dL (ref 12.0–16.0)
LYMPHS ABS: 2.8 10*3/uL (ref 1.0–3.6)
LYMPHS PCT: 30 %
MCH: 30.3 pg (ref 26.0–34.0)
MCHC: 33.5 g/dL (ref 32.0–36.0)
MCV: 90.5 fL (ref 80.0–100.0)
MONOS PCT: 8 %
Monocytes Absolute: 0.8 10*3/uL (ref 0.2–0.9)
Neutro Abs: 5.5 10*3/uL (ref 1.4–6.5)
Neutrophils Relative %: 59 %
PLATELETS: 312 10*3/uL (ref 150–440)
RBC: 4.01 MIL/uL (ref 3.80–5.20)
RDW: 14.5 % (ref 11.5–14.5)
WBC: 9.3 10*3/uL (ref 3.6–11.0)

## 2017-08-28 ENCOUNTER — Encounter: Payer: Self-pay | Admitting: Gerontology

## 2017-08-28 ENCOUNTER — Ambulatory Visit: Payer: Medicare Other | Admitting: Oncology

## 2017-08-28 ENCOUNTER — Non-Acute Institutional Stay (SKILLED_NURSING_FACILITY): Payer: Medicare Other | Admitting: Gerontology

## 2017-08-28 DIAGNOSIS — K5900 Constipation, unspecified: Secondary | ICD-10-CM | POA: Diagnosis not present

## 2017-08-28 DIAGNOSIS — E875 Hyperkalemia: Secondary | ICD-10-CM

## 2017-08-28 DIAGNOSIS — E871 Hypo-osmolality and hyponatremia: Secondary | ICD-10-CM | POA: Diagnosis not present

## 2017-08-28 DIAGNOSIS — S3210XD Unspecified fracture of sacrum, subsequent encounter for fracture with routine healing: Secondary | ICD-10-CM | POA: Diagnosis not present

## 2017-08-28 NOTE — Progress Notes (Addendum)
Location:   [facility] Room Number: 208B Place of Service:  SNF [phone]) Provider:  Toni Arthurs, NP-C  Dion Body, MD  Patient Care Team: Dion Body, MD as PCP - General (Family Medicine)  Extended Emergency Contact Information Primary Emergency Contact: Shotwell,Sheila Address: [address]          [place], [place] [address] [place] Phone: [phone] Work Phone: [phone] Mobile Phone: [phone] Relation: Daughter Secondary Emergency Contact: [place] Phone: [phone] Mobile Phone: [phone] Relation: Relative  Code Status:  DNR Goals of care: Advanced Directive information Advanced Directives 08/28/2017  Does Patient Have a Medical Advance Directive? Yes  Type of Advance Directive Out of facility DNR (pink MOST or yellow form)  Does patient want to make changes to medical advance directive? No - Patient declined  Copy of [place] in Chart? -  Would patient like information on creating a medical advance directive? -     Chief Complaint  Patient presents with  . Medical Management of Chronic Issues    Routine Visit    HPI:  Pt is a 83 y.o. female seen today for medical management of chronic diseases.  Patient was admitted to the facility for rehab following admission to [facility] status post fall with pelvic/sacral fractures.  Patient has been having ongoing issues with hyponatremia since admission.  Sodium had some improvement with 4 days of IV fluids, medication adjustments such as improved pain control with Tylenol and tramadol, as well as dietary supplementations with Gatorade twice a day, 1 L free water restriction per day and no sodium restriction on meal trays.  Though sodium has responded some, it remains subtherapeutic.  Cannot add demeclocycline due to patient history of allergic reaction to tetracyclines.  Will attempt to add sodium chloride p.o. to the medication  regimen, with slow titration to hopefully avoid CHF.  Patient also continues to complain of intermittent constipation.  She was having some diarrhea a few days ago.  She is now complaining of back pain as opposed to hip pain.  Will adjust pain medications for better pain control.  Patient denies any further issues with nausea and vomiting.  Patient continues to work with PT/OT for strengthening.  Vital signs stable.  No other complaints.   Past Medical History:  Diagnosis Date  . Borderline diabetes mellitus    A1c 5.9% 12/2013 - diet controlled  . Breast cancer ([place]) 09/15/2008   lumpectomy with Radiation  . Cancer [facility])    Breast Cancer  . Chronic renal failure    Baseline Cr  1.6 - 12/2013 followed by Dr. Holley Raring  . Ductal carcinoma of breast, stage 1 (HCC)    history followed by Dr. Gala Murdoch  . Esophageal reflux   . Gout   . High cholesterol   . Hyperlipidemia    LDL 126 08/2013  . Hypertension   . Hypothyroidism    TSH 2.35 12/2013  . Pneumonia, unspecified organism 09/2015  . Renal disorder   . Thyroid disease    Past Surgical History:  Procedure Laterality Date  . ABDOMINAL HYSTERECTOMY    . BREAST BIOPSY Left 2009   stereo Bx   +  . BREAST LUMPECTOMY Left 2009   with radiation  . COLONOSCOPY  12/08/2009   2 polyps (aged out of further screening per Dr. Vira Agar)    Allergies  Allergen Reactions  . Cephalexin Other (See Comments)  . Ciprofloxacin Other (See Comments)  . Hydrocodone-Chlorpheniramine  Other (See Comments)    Caused excessive sleepiness  . Metronidazole Other (See Comments)  . Sulfa Antibiotics Other (See Comments)  . Sulfamethoxazole-Trimethoprim     Other reaction(s): Unknown  . Tetracyclines & Related Other (See Comments)  . Tussionex Pennkinetic Er [Hydrocod Polst-Cpm Polst Er] Other (See Comments)    Allergies as of 08/28/2017      Reactions   Cephalexin Other (See Comments)   Ciprofloxacin Other (See Comments)    Hydrocodone-chlorpheniramine Other (See Comments)   Caused excessive sleepiness   Metronidazole Other (See Comments)   Sulfa Antibiotics Other (See Comments)   Sulfamethoxazole-trimethoprim    Other reaction(s): Unknown   Tetracyclines & Related Other (See Comments)   Tussionex Pennkinetic Er [hydrocod Polst-cpm Polst Er] Other (See Comments)      Medication List        Accurate as of 08/28/17  4:24 PM. Always use your most recent med list.          acetaminophen 500 MG tablet Commonly known as:  TYLENOL Take 500 mg by mouth 4 (four) times daily.   allopurinol 100 MG tablet Commonly known as:  ZYLOPRIM Take 100 mg by mouth daily.   amLODipine 5 MG tablet Commonly known as:  NORVASC Take 5 mg by mouth daily.   aspirin 325 MG EC tablet Take 325 mg by mouth 2 (two) times daily.   Cholecalciferol 4000 units Caps Take 1 capsule by mouth daily.   levothyroxine 112 MCG tablet Commonly known as:  SYNTHROID, LEVOTHROID Take 112 mcg by mouth daily before breakfast.   loperamide 2 MG tablet Commonly known as:  IMODIUM A-D Give 2 tablets (4 mg) by mouth  with first loose stool, then 1 tablet (2 mg) by mouth with each subsequent loose stool up to 8 doses in 24 hours   metoprolol tartrate 25 MG tablet Commonly known as:  LOPRESSOR Take 25 mg by mouth 2 (two) times daily.   omeprazole 20 MG tablet Commonly known as:  PRILOSEC OTC Take 20 mg by mouth daily as needed. For heartburn/indigestion.   ondansetron 4 MG tablet Commonly known as:  ZOFRAN Take 4 mg by mouth every 6 (six) hours as needed for nausea or vomiting.   polyethylene glycol packet Commonly known as:  MIRALAX / GLYCOLAX Take 17 g by mouth daily.   traMADol 50 MG tablet Commonly known as:  ULTRAM Take 1 tablet (50 mg total) by mouth every 12 (twelve) hours as needed for moderate pain or severe pain.       Review of Systems  Constitutional: Negative for activity change, appetite change, chills,  diaphoresis, fatigue and fever.  HENT: Negative for congestion, mouth sores, nosebleeds, postnasal drip, sneezing, sore throat, trouble swallowing and voice change.   Respiratory: Negative for apnea, cough, choking, chest tightness, shortness of breath and wheezing.   Cardiovascular: Negative for chest pain, palpitations and leg swelling.  Gastrointestinal: Positive for constipation. Negative for abdominal distention, abdominal pain, diarrhea, nausea and vomiting.  Genitourinary: Negative for difficulty urinating, dysuria, frequency and urgency.  Musculoskeletal: Positive for arthralgias (typical arthritis), back pain and gait problem. Negative for myalgias.  Skin: Negative for color change, pallor, rash and wound.  Neurological: Negative for dizziness, tremors, syncope, speech difficulty, weakness, numbness and headaches.  Psychiatric/Behavioral: Negative for agitation and behavioral problems.  All other systems reviewed and are negative.   Immunization History  Administered Date(s) Administered  . Influenza Split 06/24/2015  . Influenza-Unspecified 06/17/2014, 05/18/2016, 07/04/2017  . Pneumococcal Conjugate-13 09/17/2013  .  Pneumococcal Polysaccharide-23 09/17/2008   Pertinent  Health Maintenance Due  Topic Date Due  . DEXA SCAN  03/18/1993  . INFLUENZA VACCINE  Completed  . PNA vac Low Risk Adult  Completed   No flowsheet data found. Functional Status Survey:    Vitals:   08/28/17 1616  BP: 127/72  Pulse: 81  Resp: 19  Temp: 98.3 F (36.8 C)  TempSrc: Oral  SpO2: 94%  Weight: 108 lb 12.8 oz (49.4 kg)  Height: 5' 2" (1.575 m)   Body mass index is 19.9 kg/m. Physical Exam  Constitutional: She is oriented to person, place, and time. Vital signs are normal. She appears well-developed and well-nourished. She is active and cooperative. She does not appear ill. No distress.  HENT:  Head: Normocephalic and atraumatic.  Mouth/Throat: Uvula is midline, oropharynx is clear  and moist and mucous membranes are normal. Mucous membranes are not pale, not dry and not cyanotic.  Eyes: Conjunctivae, EOM and lids are normal. Pupils are equal, round, and reactive to light.  Neck: Trachea normal, normal range of motion and full passive range of motion without pain. Neck supple. No JVD present. No tracheal deviation, no edema and no erythema present. No thyromegaly present.  Cardiovascular: Normal rate, regular rhythm, normal heart sounds, intact distal pulses and normal pulses. Exam reveals no gallop, no distant heart sounds and no friction rub.  No murmur heard. Pulses:      Dorsalis pedis pulses are 2+ on the right side, and 2+ on the left side.  No edema  Pulmonary/Chest: Effort normal. No accessory muscle usage. No respiratory distress. She has no decreased breath sounds. She has no wheezes. She has no rhonchi. She has no rales. She exhibits no tenderness.  Abdominal: Soft. Normal appearance and bowel sounds are normal. She exhibits no distension and no ascites. There is no tenderness.  Musculoskeletal: Normal range of motion. She exhibits no edema or tenderness.       Lumbar back: She exhibits pain.  Expected osteoarthritis, stiffness; Bilateral Calves soft, supple. Negative Homan's Sign. B- pedal pulses equal  Neurological: She is alert and oriented to person, place, and time. She has normal strength.  Skin: Skin is warm, dry and intact. She is not diaphoretic. No cyanosis. No pallor. Nails show no clubbing.  Psychiatric: She has a normal mood and affect. Her speech is normal and behavior is normal. Judgment and thought content normal. Cognition and memory are normal.  Nursing note and vitals reviewed.   Labs reviewed: Recent Labs    08/23/17 0410 08/24/17 0730 08/27/17 1930  NA 132* 133* 125*  K 4.7 4.5 4.3  CL 100* 102 92*  CO2 _0 GLUCOSE 81 72 132*  BUN 25* 22* 25*  CREATININE 1.12* 1.10* 1.39*  CALCIUM 10.1 9.8 9.6   Recent Labs     08/10/17 0631 08/20/17 1500 08/27/17 1930  AST 40 27 34  ALT 35 26 24  ALKPHOS 111 133* 257*  BILITOT 0.8 0.5 0.7  PROT 7.0 6.8 6.5  ALBUMIN 4.1 4.0 3.8   Recent Labs    08/11/17 1453  08/13/17 0414 08/20/17 1500 08/27/17 1930  WBC 9.5   < > 7.7 9.7 9.3  NEUTROABS 6.0  --   --  7.4* 5.5  HGB 13.9   < > 12.6 11.9* 12.2  HCT 41.3   < > 37.7 35.4 36.3  MCV 92.0   < > 91.8 91.6 90.5  PLT 197   < > 191  298 312   < > = values in this interval not displayed.   No results found for: TSH No results found for: HGBA1C No results found for: CHOL, HDL, LDLCALC, LDLDIRECT, TRIG, CHOLHDL  Significant Diagnostic Results in last 30 days:  Dg Lumbar Spine Complete  Result Date: 08/10/2017 CLINICAL DATA:  Fall this morning. Low back pain. Initial encounter. EXAM: LUMBAR SPINE - COMPLETE 4+ VIEW COMPARISON:  09/30/2013 FINDINGS: There is no evidence of lumbar spine fracture. Alignment is normal. Stable mild degenerative disc disease, greatest at L3-4. Moderate bilateral lower lumbar facet DJD, mainly at L5-S1. Generalized osteopenia. No focal lytic or sclerotic bone lesions identified. Aortic atherosclerosis. IMPRESSION: Stable exam. No acute findings. Degenerative spondylosis, as described above. Electronically Signed   By: Earle Gell M.D.   On: 08/10/2017 07:49   Dg Shoulder Right  Result Date: 08/10/2017 CLINICAL DATA:  Fall this morning. Right-sided shoulder pain. Initial encounter. EXAM: RIGHT SHOULDER - 2+ VIEW COMPARISON:  None. FINDINGS: There is no evidence of fracture or dislocation. There is no evidence of arthropathy or other focal bone abnormality. Generalized osteopenia. Soft tissues are unremarkable. IMPRESSION: No acute findings.  Osteopenia. Electronically Signed   By: Earle Gell M.D.   On: 08/10/2017 07:50   Dg Elbow Complete Right  Result Date: 08/10/2017 CLINICAL DATA:  Fall this morning. Right elbow pain. Initial encounter. EXAM: RIGHT ELBOW - COMPLETE 3+ VIEW  COMPARISON:  None. FINDINGS: There is no evidence of fracture, dislocation, or joint effusion. Olecranon process bone spur noted. No other significant osseous abnormality identified. Soft tissues are unremarkable. IMPRESSION: No acute findings.  Olecranon process bone spur noted. Electronically Signed   By: Earle Gell M.D.   On: 08/10/2017 07:51   Ct Head Wo Contrast  Result Date: 08/10/2017 CLINICAL DATA:  Fall while walking to bathroom this morning. Initial encounter. EXAM: CT HEAD WITHOUT CONTRAST TECHNIQUE: Contiguous axial images were obtained from the base of the skull through the vertex without intravenous contrast. COMPARISON:  04/11/2017 FINDINGS: Brain: There is no evidence of acute infarct, intracranial hemorrhage, mass, midline shift, or extra-axial fluid collection. 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 Confluent cerebral white matter hypodensities are similar to the prior study and nonspecific but compatible with extensive chronic small vessel ischemic disease. Generalized cerebral atrophy is mild for age. Vascular: Calcified atherosclerosis at the skullbase. No hyperdense vessel. Skull: No fracture or suspicious osseous lesion. Sinuses/Orbits: Mild mucosal thickening in the paranasal sinuses. Clear mastoid air cells. Bilateral cataract extraction. Other: None. IMPRESSION: 1. No evidence of acute intracranial abnormality. 2. Extensive chronic small vessel ischemic disease. Electronically Signed   By: Logan Bores M.D.   On: 08/10/2017 08:30   Ct Pelvis Wo Contrast  Result Date: 08/11/2017 CLINICAL DATA:  83 y/o  F; fall with right hip pain. EXAM: CT PELVIS WITHOUT CONTRAST TECHNIQUE: Multidetector CT imaging of the pelvis was performed following the standard protocol without intravenous contrast. COMPARISON:  None. FINDINGS: Urinary Tract:  No abnormality visualized. Bowel:  Sigmoid diverticulosis. Vascular/Lymphatic: Calcific atherosclerosis of the  iliofemoral arteries. Reproductive: No mass or other significant abnormality. Hysterectomy. Other:  None. Musculoskeletal: Acute mildly displaced fracture of inferior pubic ramus near symphysis (series 2, image 94). Acute nondisplaced mild fracture of left inferior pubic ramus near the ischial tuberosity (series 2, image 100). Minimally displaced acute fracture right sacral ala (series 2, image 36). IMPRESSION: 1. Acute mildly displaced fracture of inferior pubic ramus near symphysis and nondisplaced buckle fracture right inferior  pubic ramus near ischial tuberosity. 2. Acute minimally displaced fracture right sacral ala. Electronically Signed   By: Kristine Garbe M.D.   On: 08/11/2017 14:19   Dg Hip Unilat W Or Wo Pelvis 2-3 Views Right  Result Date: 08/10/2017 CLINICAL DATA:  Fall this morning. Right hip pain. Initial encounter. EXAM: DG HIP (WITH OR WITHOUT PELVIS) 2-3V RIGHT COMPARISON:  04/11/2017 FINDINGS: There is no evidence of hip fracture or dislocation. Mild right hip degenerative spurring without significant joint space narrowing. No other bone lesions identified. IMPRESSION: No acute findings. Mild right hip osteoarthritis. Electronically Signed   By: Earle Gell M.D.   On: 08/10/2017 07:46    Assessment/Plan 1.  Hyponatremia  Continue regular diet with no sodium restriction  Gatorade twice daily  1 L free water restriction per day  Sodium chloride tablets 1 g p.o. daily  Recheck met c on 12/17  2.  Hyperkalemia  Resolved  3.  Constipation, unspecified constipation type  Senna S2 tablets p.o. twice daily  Increase omeprazole 20 mg twice daily  4.  Closed fracture of sacrum with routine healing, unspecified fracture morphology, subsequent encounter  Continue working with PT/OT  Continue exercises as taught by PT/OT  Continue Tylenol 500 mg p.o. 4 times daily scheduled for pain  Continue tramadol 50 mg p.o. every 4 hours as needed pain  Aspercreme 4%  Lidocaine patch- apply to the lower back Q Day. Remove patch after 12 hours.  Continue aspirin 325 mg EC tablet p.o. twice daily for DVT prophylaxis   Family/ staff Communication:   Total Time:  Documentation:  Face to Face:  Family/Phone:   Labs/tests ordered: CBC, met C on 12/17  Medication list reviewed and assessed for continued appropriateness. Monthly medication orders reviewed and signed.  Vikki Ports, NP-C [facility] [phone] [place],  [address] Cell Phone (Mon-Fri 8am-5pm):  [phone] On Call:  [phone] & follow prompts after 5pm & weekends Office Phone:  [phone] Office Fax:  [phone]

## 2017-08-29 ENCOUNTER — Encounter: Payer: Self-pay | Admitting: *Deleted

## 2017-08-29 ENCOUNTER — Other Ambulatory Visit: Payer: Self-pay

## 2017-08-29 ENCOUNTER — Observation Stay
Admission: EM | Admit: 2017-08-29 | Discharge: 2017-08-31 | Disposition: A | Discharge status: Skilled Nursing Facility | Payer: Medicare Other | Attending: Specialist | Admitting: Specialist

## 2017-08-29 DIAGNOSIS — N390 Urinary tract infection, site not specified: Secondary | ICD-10-CM | POA: Diagnosis not present

## 2017-08-29 DIAGNOSIS — L8990 Pressure ulcer of unspecified site, unspecified stage: Secondary | ICD-10-CM

## 2017-08-29 DIAGNOSIS — Z882 Allergy status to sulfonamides status: Secondary | ICD-10-CM | POA: Diagnosis not present

## 2017-08-29 DIAGNOSIS — Z66 Do not resuscitate: Secondary | ICD-10-CM | POA: Insufficient documentation

## 2017-08-29 DIAGNOSIS — N183 Chronic kidney disease, stage 3 (moderate): Secondary | ICD-10-CM | POA: Diagnosis not present

## 2017-08-29 DIAGNOSIS — E1122 Type 2 diabetes mellitus with diabetic chronic kidney disease: Secondary | ICD-10-CM | POA: Diagnosis not present

## 2017-08-29 DIAGNOSIS — I129 Hypertensive chronic kidney disease with stage 1 through stage 4 chronic kidney disease, or unspecified chronic kidney disease: Secondary | ICD-10-CM | POA: Insufficient documentation

## 2017-08-29 DIAGNOSIS — R1312 Dysphagia, oropharyngeal phase: Secondary | ICD-10-CM | POA: Insufficient documentation

## 2017-08-29 DIAGNOSIS — Z7982 Long term (current) use of aspirin: Secondary | ICD-10-CM | POA: Diagnosis not present

## 2017-08-29 DIAGNOSIS — Z79899 Other long term (current) drug therapy: Secondary | ICD-10-CM | POA: Diagnosis not present

## 2017-08-29 DIAGNOSIS — E039 Hypothyroidism, unspecified: Secondary | ICD-10-CM | POA: Diagnosis not present

## 2017-08-29 DIAGNOSIS — E7800 Pure hypercholesterolemia, unspecified: Secondary | ICD-10-CM | POA: Insufficient documentation

## 2017-08-29 DIAGNOSIS — E860 Dehydration: Secondary | ICD-10-CM | POA: Insufficient documentation

## 2017-08-29 DIAGNOSIS — K219 Gastro-esophageal reflux disease without esophagitis: Secondary | ICD-10-CM | POA: Insufficient documentation

## 2017-08-29 DIAGNOSIS — Z853 Personal history of malignant neoplasm of breast: Secondary | ICD-10-CM | POA: Insufficient documentation

## 2017-08-29 DIAGNOSIS — E871 Hypo-osmolality and hyponatremia: Secondary | ICD-10-CM | POA: Diagnosis present

## 2017-08-29 DIAGNOSIS — R4182 Altered mental status, unspecified: Secondary | ICD-10-CM | POA: Diagnosis present

## 2017-08-29 DIAGNOSIS — M109 Gout, unspecified: Secondary | ICD-10-CM | POA: Insufficient documentation

## 2017-08-29 LAB — URINALYSIS, COMPLETE (UACMP) WITH MICROSCOPIC
BILIRUBIN URINE: NEGATIVE
Glucose, UA: NEGATIVE mg/dL
KETONES UR: 5 mg/dL — AB
Leukocytes, UA: NEGATIVE
NITRITE: POSITIVE — AB
PROTEIN: NEGATIVE mg/dL
SPECIFIC GRAVITY, URINE: 1.005 (ref 1.005–1.030)
pH: 5 (ref 5.0–8.0)

## 2017-08-29 LAB — COMPREHENSIVE METABOLIC PANEL
ALBUMIN: 4.3 g/dL (ref 3.5–5.0)
ALT: 34 U/L (ref 14–54)
ANION GAP: 13 (ref 5–15)
AST: 45 U/L — ABNORMAL HIGH (ref 15–41)
Alkaline Phosphatase: 313 U/L — ABNORMAL HIGH (ref 38–126)
BUN: 20 mg/dL (ref 6–20)
CALCIUM: 10.1 mg/dL (ref 8.9–10.3)
CO2: 23 mmol/L (ref 22–32)
Chloride: 89 mmol/L — ABNORMAL LOW (ref 101–111)
Creatinine, Ser: 0.97 mg/dL (ref 0.44–1.00)
GFR calc non Af Amer: 50 mL/min — ABNORMAL LOW (ref >60)
GFR, EST AFRICAN AMERICAN: 58 mL/min — AB (ref >60)
GLUCOSE: 93 mg/dL (ref 65–99)
POTASSIUM: 4 mmol/L (ref 3.5–5.1)
SODIUM: 125 mmol/L — AB (ref 135–145)
TOTAL PROTEIN: 7.6 g/dL (ref 6.5–8.1)
Total Bilirubin: 0.6 mg/dL (ref 0.3–1.2)

## 2017-08-29 LAB — CBC
HEMATOCRIT: 42.4 % (ref 35.0–47.0)
HEMOGLOBIN: 14.2 g/dL (ref 12.0–16.0)
MCH: 30.3 pg (ref 26.0–34.0)
MCHC: 33.5 g/dL (ref 32.0–36.0)
MCV: 90.5 fL (ref 80.0–100.0)
Platelets: 284 10*3/uL (ref 150–440)
RBC: 4.68 MIL/uL (ref 3.80–5.20)
RDW: 14.3 % (ref 11.5–14.5)
WBC: 11.6 10*3/uL — ABNORMAL HIGH (ref 3.6–11.0)

## 2017-08-29 LAB — LIPASE, BLOOD: Lipase: 29 U/L (ref 11–51)

## 2017-08-29 MED ORDER — ENOXAPARIN SODIUM 40 MG/0.4ML ~~LOC~~ SOLN
40.0000 mg | SUBCUTANEOUS | Status: DC
  Administered 2017-08-29: 21:00:00 40 mg via SUBCUTANEOUS
  Filled 2017-08-29: qty 0.4

## 2017-08-29 MED ORDER — ALLOPURINOL 100 MG PO TABS
100.0000 mg | ORAL_TABLET | Freq: Every day | ORAL | Status: DC
  Administered 2017-08-30 – 2017-08-31 (×2): 100 mg via ORAL
  Filled 2017-08-29 (×2): qty 1

## 2017-08-29 MED ORDER — AMLODIPINE BESYLATE 5 MG PO TABS
5.0000 mg | ORAL_TABLET | Freq: Every day | ORAL | Status: DC
  Administered 2017-08-30 – 2017-08-31 (×2): 5 mg via ORAL
  Filled 2017-08-29 (×2): qty 1

## 2017-08-29 MED ORDER — ONDANSETRON HCL 4 MG PO TABS: 4.0000 mg | ORAL_TABLET | Freq: Four times a day (QID) | ORAL | Status: DC | PRN

## 2017-08-29 MED ORDER — ASPIRIN EC 325 MG PO TBEC
325.0000 mg | DELAYED_RELEASE_TABLET | Freq: Two times a day (BID) | ORAL | Status: DC
  Administered 2017-08-29 – 2017-08-31 (×4): 325 mg via ORAL
  Filled 2017-08-29 (×4): qty 1

## 2017-08-29 MED ORDER — PANTOPRAZOLE SODIUM 40 MG PO TBEC
40.0000 mg | DELAYED_RELEASE_TABLET | ORAL | Status: DC
  Administered 2017-08-30 – 2017-08-31 (×2): 40 mg via ORAL
  Filled 2017-08-29 (×2): qty 1

## 2017-08-29 MED ORDER — TRAMADOL HCL 50 MG PO TABS
50.0000 mg | ORAL_TABLET | Freq: Two times a day (BID) | ORAL | Status: DC | PRN
  Administered 2017-08-29: 21:00:00 50 mg via ORAL
  Filled 2017-08-29: qty 1

## 2017-08-29 MED ORDER — SODIUM CHLORIDE 0.9 % IV BOLUS (SEPSIS)
1000.0000 mL | Freq: Once | INTRAVENOUS | Status: AC
  Administered 2017-08-29: 1000 mL via INTRAVENOUS

## 2017-08-29 MED ORDER — VITAMIN D 1000 UNITS PO TABS
4000.0000 [IU] | ORAL_TABLET | Freq: Every day | ORAL | Status: DC
  Administered 2017-08-30 – 2017-08-31 (×2): 4000 [IU] via ORAL
  Filled 2017-08-29 (×2): qty 4

## 2017-08-29 MED ORDER — SODIUM CHLORIDE 0.9 % IV SOLN
INTRAVENOUS | Status: DC
  Administered 2017-08-29 – 2017-08-30 (×2): via INTRAVENOUS

## 2017-08-29 MED ORDER — ONDANSETRON HCL 4 MG/2ML IJ SOLN
4.0000 mg | Freq: Four times a day (QID) | INTRAMUSCULAR | Status: DC | PRN
  Administered 2017-08-31: 06:00:00 4 mg via INTRAVENOUS
  Filled 2017-08-29: qty 2

## 2017-08-29 MED ORDER — ACETAMINOPHEN 325 MG PO TABS
650.0000 mg | ORAL_TABLET | Freq: Once | ORAL | Status: AC
  Administered 2017-08-29: 650 mg via ORAL
  Filled 2017-08-29: qty 2

## 2017-08-29 MED ORDER — LEVOTHYROXINE SODIUM 112 MCG PO TABS
112.0000 ug | ORAL_TABLET | Freq: Every day | ORAL | Status: DC
  Administered 2017-08-30 – 2017-08-31 (×2): 112 ug via ORAL
  Filled 2017-08-29 (×2): qty 1

## 2017-08-29 MED ORDER — SENNOSIDES-DOCUSATE SODIUM 8.6-50 MG PO TABS: 1.0000 | ORAL_TABLET | Freq: Every evening | ORAL | Status: DC | PRN

## 2017-08-29 MED ORDER — ACETAMINOPHEN 650 MG RE SUPP: 650.0000 mg | Freq: Four times a day (QID) | RECTAL | Status: DC | PRN

## 2017-08-29 MED ORDER — ENALAPRIL MALEATE 20 MG PO TABS
20.0000 mg | ORAL_TABLET | Freq: Two times a day (BID) | ORAL | Status: DC
Start: 2017-08-30 — End: 2017-08-31
  Administered 2017-08-30 – 2017-08-31 (×3): 20 mg via ORAL
  Filled 2017-08-29 (×4): qty 1

## 2017-08-29 MED ORDER — ACETAMINOPHEN 325 MG PO TABS
650.0000 mg | ORAL_TABLET | Freq: Four times a day (QID) | ORAL | Status: DC | PRN
  Filled 2017-08-29: qty 2

## 2017-08-29 NOTE — Progress Notes (Signed)
Pt arrived from ED with confusion. This is pts baseline per daughter. Pt from [facility] and daughter requesting social work to inquire about new SNF placement. Pt c/o severe back pain, Md paged for additional pain medication. Pt refusing tylenol. Family at bedside. Will continue to monitor.

## 2017-08-29 NOTE — Clinical Social Work Placement (Signed)
   CLINICAL SOCIAL WORK PLACEMENT  NOTE  Date:  08/29/2017  Patient Details  Name: Samantha Bowen MRN: [id number] Date of Birth: 27-Oct-1925  Clinical Social Work is seeking post-discharge placement for this patient at the [place] level of care (*CSW will initial, date and re-position this form in  chart as items are completed):  Yes   Patient/family provided with [place] Work Department's list of facilities offering this level of care within the geographic area requested by the patient (or if unable, by the patient's family).  Yes   Patient/family informed of their freedom to choose among providers that offer the needed level of care, that participate in Medicare, Medicaid or managed care program needed by the patient, have an available bed and are willing to accept the patient.  Yes   Patient/family informed of [place]'s ownership interest in [facility] and [facility], as well as of the fact that they are under no obligation to receive care at these facilities.  PASRR submitted to EDS on       PASRR number received on       Existing PASRR number confirmed on 08/29/17     FL2 transmitted to all facilities in geographic area requested by pt/family on 08/29/17     FL2 transmitted to all facilities within larger geographic area on       Patient informed that his/her managed care company has contracts with or will negotiate with certain facilities, including the following:            Patient/family informed of bed offers received.  Patient chooses bed at       Physician recommends and patient chooses bed at      Patient to be transferred to   on  .  Patient to be transferred to facility by       Patient family notified on   of transfer.  Name of family member notified:        PHYSICIAN       Additional Comment:    _______________________________________________ Kennith Morss, Veronia Beets, LCSW 08/29/2017, 4:43 PM

## 2017-08-29 NOTE — Clinical Social Work Note (Signed)
Clinical Social Work Assessment  Patient Details  Name: Samantha Bowen MRN: [id number] Date of Birth: 12-02-1925  Date of referral:  08/29/17               Reason for consult:  Facility Placement                Permission sought to share information with:  Chartered certified accountant granted to share information::  Yes, Verbal Permission Granted  Name::      [place]::   [place]   Relationship::     Contact Information:     Housing/Transportation Living arrangements for the past 2 months:  [place], [place] of Information:  Adult Children Patient Interpreter Needed:  None Criminal Activity/Legal Involvement Pertinent to Current Situation/Hospitalization:  No - Comment as needed Significant Relationships:  Adult Children Lives with:  Facility Resident Do you feel safe going back to the place where you live?  Yes Need for family participation in patient care:  Yes (Comment)  Care giving concerns:  Patient is a resident at [facility] and went to [facility] for short term rehab.    Social Worker assessment / plan:  Holiday representative (CSW) received SNF consult. Per chart patient is from [facility]. CSW contacted patient's daughter/ HPOA Freda Munro to complete assessment because her chart patient is not alert and oriented. Per daughter patient has been at [facility] for 2 weeks and she has not been happy with the care. Per daughter Heron Nay did not treat patient's low sodium. Per daughter patient is a resident at [facility] and will return there after rehab. Daughter requested a new rehab facility and prefers [facility] or [facility]. FL2 complete and faxed out. CSW will continue to follow and assist as needed.   Employment status:  Retired Nurse, adult PT Recommendations:  Not assessed at this time Information / Referral to community resources:  [place]  Patient/Family's Response to care:  Patient's daughter requested a new rehab facility in [place].   Patient/Family's Understanding of and Emotional Response to Diagnosis, Current Treatment, and Prognosis:  Patient's daughter was very pleasant and thanked CSW for assistance.   Emotional Assessment Appearance:  Appears stated age Attitude/Demeanor/Rapport:  Unable to Assess Affect (typically observed):  Unable to Assess Orientation:  Oriented to Self, Fluctuating Orientation (Suspected and/or reported Sundowners), Oriented to Situation, Oriented to Place Alcohol / Substance use:  Not Applicable Psych involvement (Current and /or in the community):  No (Comment)  Discharge Needs  Concerns to be addressed:  Discharge Planning Concerns Readmission within the last 30 days:  No Current discharge risk:  Dependent with Mobility Barriers to Discharge:  Continued Medical Work up   [employer], Veronia Beets, LCSW 08/29/2017, 4:44 PM

## 2017-08-29 NOTE — ED Provider Notes (Signed)
[facility] Emergency Department Provider Note  ____________________________________________  Time seen: Approximately 12:37 PM  I have reviewed the triage vital signs and the nursing notes.   HISTORY  Chief Complaint Hyponatremia    HPI Samantha Bowen is a 83 y.o. female with recent pelvic fractures in rehab presenting with hyponatremia and altered mental status.  The patient is unable to give any history, but history is obtained from the patient's daughter.  Per report, the patient has had recurrent hyponatremia that is usually treated with intravenous fluids with improvement of symptoms, particularly altered mental status.  Per report, the patient's sodium came back 125 2 days ago, no interventions have been made at the rehabilitation facility.  Since then, the patient has been more confused than usual, intermittently some.  She has not been eating and drinking normally.  There is no history of recent falls or injury, fevers or chills, cough or cold symptoms.  She has had several episodes of vomiting without abdominal pain.  Past Medical History:  Diagnosis Date  . Borderline diabetes mellitus    A1c 5.9% 12/2013 - diet controlled  . Breast cancer ([place]) 09/15/2008   lumpectomy with Radiation  . Cancer [facility])    Breast Cancer  . Chronic renal failure    Baseline Cr  1.6 - 12/2013 followed by Dr. Holley Raring  . Ductal carcinoma of breast, stage 1 (HCC)    history followed by Dr. Gala Murdoch  . Esophageal reflux   . Gout   . High cholesterol   . Hyperlipidemia    LDL 126 08/2013  . Hypertension   . Hypothyroidism    TSH 2.35 12/2013  . Pneumonia, unspecified organism 09/2015  . Renal disorder   . Thyroid disease     Patient Active Problem List   Diagnosis Date Noted  . Closed fracture of left inferior pubic ramus ([place])   . DNR (do not resuscitate)   . Palliative care by specialist   . Sacral fracture ([place]) 08/11/2017  . Primary cancer of left female  breast ([place]) 08/26/2016  . Pneumonia 01/15/2016    Past Surgical History:  Procedure Laterality Date  . ABDOMINAL HYSTERECTOMY    . BREAST BIOPSY Left 2009   stereo Bx   +  . BREAST LUMPECTOMY Left 2009   with radiation  . COLONOSCOPY  12/08/2009   2 polyps (aged out of further screening per Dr. Vira Agar)    Current Outpatient Rx  . Order #: [id number] Class: Historical Med  . Order #: [id number] Class: Historical Med  . Order #: [id number] Class: Historical Med  . Order #: [id number] Class: Historical Med  . Order #: [id number] Class: Historical Med  . Order #: [id number] Class: Historical Med  . Order #: [id number] Class: Historical Med  . Order #: [id number] Class: Historical Med  . Order #: [id number] Class: Historical Med  . Order #: [id number] Class: Historical Med  . Order #: [id number] Class: Historical Med  . Order #: [id number] Class: Print    Allergies Cephalexin; Ciprofloxacin; Hydrocodone-chlorpheniramine; Metronidazole; Sulfa antibiotics; Sulfamethoxazole-trimethoprim; Tetracyclines & related; and Tussionex pennkinetic er [hydrocod polst-cpm polst er]  Family History  Problem Relation Age of Onset  . CVA Mother   . Cancer Sister   . Breast cancer Neg Hx     Social History Social History   Tobacco Use  . Smoking status: Never Smoker  . Smokeless tobacco: Never Used  Substance Use Topics  . Alcohol use: No  . Drug use: No    Review of Systems Constitutional: No fever/chills.  Positive altered  mental status.  No lightheadedness or syncope. Eyes: No visual changes.  No eye discharge. ENT: No sore throat. No congestion or rhinorrhea. Cardiovascular: Denies chest pain. Denies palpitations. Respiratory: Denies shortness of breath.  No cough. Gastrointestinal: No abdominal pain.  Positive nausea, and vomiting.  No diarrhea.  No constipation. Genitourinary: Negative for dysuria. Musculoskeletal: Positive for chronic unchanged low back pain. Skin: Negative for  rash. Neurological: Negative for headaches. No focal numbness, tingling or weakness.  Ambulates using a walker.    ____________________________________________   PHYSICAL EXAM:  VITAL SIGNS: ED Triage Vitals  Enc Vitals Group     BP 08/29/17 1139 (!) 162/81     Pulse Rate 08/29/17 1139 (!) 103     Resp 08/29/17 1139 18     Temp --      Temp src --      SpO2 08/29/17 1139 95 %     Weight 08/29/17 1140 108 lb (49 kg)     Height 08/29/17 1140 5\' 2"  (1.575 m)     Head Circumference --      Peak Flow --      Pain Score --      Pain Loc --      Pain Edu? --      Excl. in [place]? --     Constitutional: Patient is alert to person and place but not to the year; per her daughter this is baseline.  She is chronically ill-appearing and nontoxic. Eyes: Conjunctivae are normal.  EOMI. No scleral icterus. Head: Atraumatic. Nose: No congestion/rhinnorhea. Mouth/Throat: Mucous membranes are dry.  Neck: No stridor.  Supple.  No JVD.  No meningismus. Cardiovascular: Fast rate, regular rhythm. No murmurs, rubs or gallops.  Respiratory: Normal respiratory effort.  No accessory muscle use or retractions. Lungs CTAB.  No wheezes, rales or ronchi. Gastrointestinal: Soft, nontender and nondistended.  No guarding or rebound.  No peritoneal signs. Musculoskeletal: No LE edema. No ttp in the calves or palpable cords.  Negative Homan's sign. Neurologic:  A&Ox2.  Speech is clear.  Face and smile are symmetric.  EOMI.  Moves all extremities well. Skin:  Skin is warm, dry and intact. No rash noted. Psychiatric: Mood and affect are normal.   ____________________________________________   LABS (all labs ordered are listed, but only abnormal results are displayed)  Labs Reviewed  COMPREHENSIVE METABOLIC PANEL - Abnormal; Notable for the following components:      Result Value   Sodium 125 (*)    Chloride 89 (*)    AST 45 (*)    Alkaline Phosphatase 313 (*)    GFR calc non Af Amer 50 (*)    GFR calc  Af Amer 58 (*)    All other components within normal limits  CBC - Abnormal; Notable for the following components:   WBC 11.6 (*)    All other components within normal limits  LIPASE, BLOOD  URINALYSIS, COMPLETE (UACMP) WITH MICROSCOPIC   ____________________________________________  EKG  ED ECG REPORT I, Eula Listen, the attending physician, personally viewed and interpreted this ECG.   Date: 08/29/2017  EKG Time: 1320  Rate: 101  Rhythm: sinus tachycardia  Axis: normal  Intervals:none  ST&T Change: No STEMI  ____________________________________________  RADIOLOGY  No results found.  ____________________________________________   PROCEDURES  Procedure(s) performed: None  Procedures  Critical Care performed: No ____________________________________________   INITIAL IMPRESSION / ASSESSMENT AND PLAN / ED COURSE  Pertinent labs & imaging results that were available during my care of the  patient were reviewed by me and considered in my medical decision making (see chart for details).  83 y.o. female with a recent history of pelvic fractures, decreased p.o. intake, recurrent hyponatremia presenting with altered mental status and vomiting.  Overall, the patient is hemodynamically stable with some mild tachycardia and dry mucous membranes which is consistent with dehydration from her decreased p.o. intake.  Her laboratory studies do show a sodium of 125 and I have ordered intravenous fluids for her.  There is no evidence of an acute infectious process, nor does she have any abnormalities suggestive of an acute intra-abdominal surgical pathology on my exam.  At this time, the patient has been admitted to the hospitalist for further evaluation and treatment.  ____________________________________________  FINAL CLINICAL IMPRESSION(S) / ED DIAGNOSES  Final diagnoses:  Hyponatremia  Altered mental status, unspecified altered mental status type  Dehydration          NEW MEDICATIONS STARTED DURING THIS VISIT:  This SmartLink is deprecated. Use AVSMEDLIST instead to display the medication list for a patient.    Eula Listen, MD 08/29/17 1323

## 2017-08-29 NOTE — NC FL2 (Signed)
  [place] LEVEL OF CARE SCREENING TOOL     IDENTIFICATION  Patient Name: Samantha Bowen Birthdate: Mar 10, 1926 Sex: female Admission Date (Current Location): 08/29/2017  [place] and [place] Number:  Engineering geologist and Address:  [facility], [address] [place], [place] [address]      Provider Number: [id number]  Attending Physician Name and Address:  Saundra Shelling, MD  Relative Name and Phone Number:       Current Level of Care: Hospital Recommended Level of Care: [place] Prior Approval Number:    Date Approved/Denied:   PASRR Number: ([id number] )  Discharge Plan: SNF    Current Diagnoses: Patient Active Problem List   Diagnosis Date Noted  . Hyponatremia 08/29/2017  . Closed fracture of left inferior pubic ramus ([place])   . DNR (do not resuscitate)   . Palliative care by specialist   . Sacral fracture ([place]) 08/11/2017  . Primary cancer of left female breast ([place]) 08/26/2016  . Pneumonia 01/15/2016    Orientation RESPIRATION BLADDER Height & Weight     Self, Time, Place  Normal Continent Weight: 108 lb (49 kg) Height:  5\' 2"  (157.5 cm)  BEHAVIORAL SYMPTOMS/MOOD NEUROLOGICAL BOWEL NUTRITION STATUS      Continent Diet(Diet: NPO to be advanced. )  AMBULATORY STATUS COMMUNICATION OF NEEDS Skin   Extensive Assist Verbally PU Stage and Appropriate Care(pressure injury on sacrum. )                       Personal Care Assistance Level of Assistance  Bathing, Feeding, Dressing Bathing Assistance: Limited assistance Feeding assistance: Independent Dressing Assistance: Limited assistance     Functional Limitations Info  Sight, Hearing, Speech Sight Info: Adequate Hearing Info: Impaired Speech Info: Adequate    SPECIAL CARE FACTORS FREQUENCY  PT (By licensed PT), OT (By licensed OT)     PT Frequency: (5) OT Frequency: (5)            Contractures      Additional Factors Info  Code  Status, Allergies Code Status Info: (partial code. ) Allergies Info: (Cephalexin, Ciprofloxacin, Hydrocodone-chlorpheniramine, Metronidazole, Sulfa Antibiotics, Sulfamethoxazole-trimethoprim, Tetracyclines & Related, Tussionex Pennkinetic Er Hydrocod Polst-cpm. )           Current Medications (08/29/2017):  This is the current hospital active medication list No current facility-administered medications for this encounter.      Discharge Medications: Please see discharge summary for a list of discharge medications.  Relevant Imaging Results:  Relevant Lab Results:   Additional Information (SSN: [id number])  Demorio Seeley, Veronia Beets, LCSW

## 2017-08-29 NOTE — Progress Notes (Signed)
Notified by Magdalen Spatz, Charge RN 1C, patient was having pain, staff had been paging Dr. Estanislado Pandy since 1900 and her daughter was upset. I paged Dr. Estanislado Pandy at [phone], using [phone]. Noted number in Amion was listed at [phone]. Paged Dr. Bridgett Larsson, other Hospitalist. Did not get a return call.Requested Dr. Estanislado Pandy be overhead paged. Dr. Bridgett Larsson returned call to pt RN. 89 Spoke with Dr. Bridgett Larsson in person in ED stating this patient was in severe pain. He stated he had been contacted by the RN.Spoke with Johny Drilling, patient daughter about situation

## 2017-08-29 NOTE — H&P (Addendum)
[facility] [facility] [place] NAME: Samantha Bowen    MR#:  [id number]  DATE OF BIRTH:  1926-03-04  DATE OF ADMISSION:  08/29/2017  PRIMARY CARE PHYSICIAN: Dion Body, MD   REQUESTING/REFERRING PHYSICIAN:   CHIEF COMPLAINT:   Chief Complaint  Patient presents with  . Hyponatremia    HISTORY OF PRESENT ILLNESS: Samantha Bowen  is a 84 y.o. female with a known history of diabetes mellitus, breast cancer, chronic kidney disease, ductal carcinoma breast stage I, GERD, gout, hyperlipidemia, hypertension, hypothyroidism, thyroid disease presented to the emergency room for weakness. Patient also has a recent history of pelvic fractures. And patient was seen in the emergency room she was awake and alert and responded to verbal commands. She is hard of hearing. Patient has low sodium level of 125. She has been not eating well and drinking enough fluids. Patient is a resident of head showed facility. Patient had a history of hyponatremia in the past. Patient is a hard of hearing and uses hearing aids. No complaints of any chest pain, shortness of breath. No fever and chills. Hospitalist service was consulted for further care.  PAST MEDICAL HISTORY:   Past Medical History:  Diagnosis Date  . Borderline diabetes mellitus    A1c 5.9% 12/2013 - diet controlled  . Breast cancer ([place]) 09/15/2008   lumpectomy with Radiation  . Cancer [facility])    Breast Cancer  . Chronic renal failure    Baseline Cr  1.6 - 12/2013 followed by Dr. Holley Raring  . Ductal carcinoma of breast, stage 1 (HCC)    history followed by Dr. Gala Murdoch  . Esophageal reflux   . Gout   . High cholesterol   . Hyperlipidemia    LDL 126 08/2013  . Hypertension   . Hypothyroidism    TSH 2.35 12/2013  . Pneumonia, unspecified organism 09/2015  . Renal disorder   . Thyroid disease     PAST SURGICAL HISTORY:  Past Surgical History:  Procedure Laterality Date  . ABDOMINAL HYSTERECTOMY     . BREAST BIOPSY Left 2009   stereo Bx   +  . BREAST LUMPECTOMY Left 2009   with radiation  . COLONOSCOPY  12/08/2009   2 polyps (aged out of further screening per Dr. Vira Agar)    SOCIAL HISTORY:  Social History   Tobacco Use  . Smoking status: Never Smoker  . Smokeless tobacco: Never Used  Substance Use Topics  . Alcohol use: No    FAMILY HISTORY:  Family History  Problem Relation Age of Onset  . CVA Mother   . Cancer Sister   . Breast cancer Neg Hx     DRUG ALLERGIES:  Allergies  Allergen Reactions  . Cephalexin Other (See Comments)  . Ciprofloxacin Other (See Comments)  . Hydrocodone-Chlorpheniramine Other (See Comments)    Caused excessive sleepiness  . Metronidazole Other (See Comments)  . Sulfa Antibiotics Other (See Comments)  . Sulfamethoxazole-Trimethoprim     Other reaction(s): Unknown  . Tetracyclines & Related Other (See Comments)  . Tussionex Pennkinetic Er [Hydrocod Polst-Cpm Polst Er] Other (See Comments)    REVIEW OF SYSTEMS:   CONSTITUTIONAL: No fever, has weakness.  EYES: No blurred or double vision.  EARS, NOSE, AND THROAT: No tinnitus or ear pain.  RESPIRATORY: No cough, shortness of breath, wheezing or hemoptysis.  CARDIOVASCULAR: No chest pain, orthopnea, edema.  GASTROINTESTINAL: No nausea, vomiting, diarrhea or abdominal pain.  GENITOURINARY: No dysuria, hematuria.  ENDOCRINE: No  polyuria, nocturia,  HEMATOLOGY: No anemia, easy bruising or bleeding SKIN: No rash or lesion. MUSCULOSKELETAL: No joint pain or arthritis.   NEUROLOGIC: No tingling, numbness, weakness.  PSYCHIATRY: No anxiety or depression.   MEDICATIONS AT HOME:  Prior to Admission medications   Medication Sig Start Date End Date Taking? Authorizing Provider  acetaminophen (TYLENOL) 500 MG tablet Take 500 mg by mouth 4 (four) times daily.    Yes [provider]  allopurinol (ZYLOPRIM) 100 MG tablet Take 100 mg by mouth daily. 11/15/15  Yes [provider]  amLODipine (NORVASC) 5 MG tablet Take 5 mg by mouth daily.  03/24/15  Yes [provider]  enalapril (VASOTEC) 20 MG tablet Take 20 mg by mouth 2 (two) times daily.   Yes [provider]  traMADol (ULTRAM) 50 MG tablet Take 1 tablet (50 mg total) by mouth every 12 (twelve) hours as needed for moderate pain or severe pain. 08/13/17  Yes Max Sane, MD  aspirin 325 MG EC tablet Take 325 mg by mouth 2 (two) times daily.    [provider]  Cholecalciferol 4000 units CAPS Take 1 capsule by mouth daily.    [provider]  levothyroxine (SYNTHROID, LEVOTHROID) 112 MCG tablet Take 112 mcg by mouth daily before breakfast.    [provider]  omeprazole (PRILOSEC OTC) 20 MG tablet Take 20 mg by mouth daily as needed. For heartburn/indigestion.    [provider]      PHYSICAL EXAMINATION:   VITAL SIGNS: Blood pressure (!) 162/81, pulse (!) 103, resp. rate 18, height 5\' 2"  (1.575 m), weight 49 kg (108 lb), SpO2 95 %.  GENERAL:  83 y.o.-year-old patient lying in the bed with no acute distress.  EYES: Pupils equal, round, reactive to light and accommodation. No scleral icterus. Extraocular muscles intact.  HEENT: Head atraumatic, normocephalic. Oropharynx dry  and nasopharynx clear.  Has hearing aids. NECK:  Supple, no jugular venous distention. No thyroid enlargement, no tenderness.  LUNGS: Normal breath sounds bilaterally, no wheezing, rales,rhonchi or crepitation. No use of accessory muscles of respiration.  CARDIOVASCULAR: S1, S2 tachycardia noted. No murmurs, rubs, or gallops.  ABDOMEN: Soft, nontender, nondistended. Bowel sounds present. No organomegaly or mass.  EXTREMITIES: No pedal edema, cyanosis, or clubbing.  NEUROLOGIC: Cranial nerves II through XII are intact. Muscle strength 5/5 in all extremities. Sensation intact. Gait not checked.  PSYCHIATRIC: The patient is alert and oriented x 3.  SKIN: No obvious rash, lesion, or ulcer.    LABORATORY PANEL:   CBC Recent Labs  Lab 08/27/17 1930 08/29/17 1143  WBC 9.3 11.6*  HGB 12.2 14.2  HCT 36.3 42.4  PLT 312 284  MCV 90.5 90.5  MCH 30.3 30.3  MCHC 33.5 33.5  RDW 14.5 14.3  LYMPHSABS 2.8  --   MONOABS 0.8  --   EOSABS 0.2  --   BASOSABS 0.1  --    ------------------------------------------------------------------------------------------------------------------  Chemistries  Recent Labs  Lab 08/23/17 0410 08/24/17 0730 08/27/17 1930 08/29/17 1143  NA 132* 133* 125* 125*  K 4.7 4.5 4.3 4.0  CL 100* 102 92* 89*  CO2 23 23 22 23   GLUCOSE 81 72 132* 93  BUN 25* 22* 25* 20  CREATININE 1.12* 1.10* 1.39* 0.97  CALCIUM 10.1 9.8 9.6 10.1  AST  --   --  34 45*  ALT  --   --  24 34  ALKPHOS  --   --  257* 313*  BILITOT  --   --  0.7 0.6   ------------------------------------------------------------------------------------------------------------------ estimated creatinine clearance is 30.4 mL/min (by C-G formula based on SCr of 0.97 mg/dL). ------------------------------------------------------------------------------------------------------------------ No results for input(s): TSH, T4TOTAL, T3FREE, THYROIDAB in the last 72 hours.  Invalid input(s): FREET3   Coagulation profile No results for input(s): INR, PROTIME in the last 168 hours. ------------------------------------------------------------------------------------------------------------------- No results for input(s): DDIMER in the last 72 hours. -------------------------------------------------------------------------------------------------------------------  Cardiac Enzymes No results for input(s): CKMB, TROPONINI, MYOGLOBIN in the last 168 hours.  Invalid input(s): CK ------------------------------------------------------------------------------------------------------------------ Invalid input(s):  POCBNP  ---------------------------------------------------------------------------------------------------------------  Urinalysis    Component Value Date/Time   COLORURINE YELLOW (A) 08/29/2017 1245   APPEARANCEUR CLEAR (A) 08/29/2017 1245   LABSPEC 1.005 08/29/2017 1245   PHURINE 5.0 08/29/2017 1245   GLUCOSEU NEGATIVE 08/29/2017 1245   HGBUR SMALL (A) 08/29/2017 1245   BILIRUBINUR NEGATIVE 08/29/2017 1245   KETONESUR 5 (A) 08/29/2017 1245   PROTEINUR NEGATIVE 08/29/2017 1245   NITRITE POSITIVE (A) 08/29/2017 1245   LEUKOCYTESUR NEGATIVE 08/29/2017 1245     RADIOLOGY: No results found.  EKG: Orders placed or performed during the hospital encounter of 08/29/17  . ED EKG  . ED EKG  . EKG 12-Lead  . EKG 12-Lead    IMPRESSION AND PLAN: 83 year old elderly female patient who is a resident of the facility with history of hypertension, hyperlipidemia, hypothyroidism and breast cancer presented to the emergency room with the generalized weakness and low sodium.  Admitting diagnosis 1. Hyponatremia 2. Dehydration 3. Hypertension 4. Hyperlipidemia 5. History of breast cancer  Treatment plan Admit patient to medical for observation bed IV fluid hydration Follow-up electrolytes Resume hypertensive medication DVT prophylaxis subcutaneous Lovenox 40 MG daily Supportive care  All the records are reviewed and case discussed with ED provider. Management plans discussed with the patient, family and they are in agreement.  CODE STATUS:DNR Code Status History    Date Active Date Inactive Code Status Order ID Comments User Context   08/11/2017 16:14 08/13/2017 14:00 DNR [id number]  Loletha Grayer, MD ED   01/16/2016 00:44 01/16/2016 18:47 Partial Code [id number]  Lance Coon, MD Inpatient   01/15/2016 22:38 01/16/2016 00:44 DNR [id number]  Nicholes Mango, MD Inpatient    Questions for Most Recent Historical Code Status (Order [id number])    Question Answer Comment   In the event of  cardiac or respiratory ARREST Do not call a "code blue"    In the event of cardiac or respiratory ARREST Do not perform Intubation, CPR, defibrillation or ACLS    In the event of cardiac or respiratory ARREST Use medication by any route, position, wound care, and other measures to relive pain and suffering. May use oxygen, suction and manual treatment of airway obstruction as needed for comfort.    Comments nurse may pronounce         Advance Directive Documentation     Most Recent Value  Type of Advance Directive  Out of facility DNR (pink MOST or yellow form), Healthcare Power of Attorney, Living will  Pre-existing out of facility DNR order (yellow form or pink MOST form)  No data  "MOST" Form in Place?  No data       TOTAL TIME TAKING CARE OF THIS PATIENT: 50 minutes.    Saundra Shelling M.D on 08/29/2017 at 1:54 PM  Between 7am to 6pm - Pager - [phone]  After 6pm go to [email or web address] - password [email or web address] [facility]  Office  [phone]  CC: Primary care physician; Dion Body, MD

## 2017-08-29 NOTE — ED Triage Notes (Signed)
Pt to Ed from [facility] after daughter requested evaluation for hyponatremia. Pt was previously dx and reports facility is treating with PO medications. Daughter also requesting placement in a new facility.    Hx of Dementia, HTN and back pain ( pt reports back pain at this time) DENTURES WITH PT IN PINK CUP

## 2017-08-30 DIAGNOSIS — L8990 Pressure ulcer of unspecified site, unspecified stage: Secondary | ICD-10-CM

## 2017-08-30 LAB — CBC
HEMATOCRIT: 37.8 % (ref 35.0–47.0)
HEMOGLOBIN: 12.9 g/dL (ref 12.0–16.0)
MCH: 30.7 pg (ref 26.0–34.0)
MCHC: 34.1 g/dL (ref 32.0–36.0)
MCV: 90.1 fL (ref 80.0–100.0)
Platelets: 262 10*3/uL (ref 150–440)
RBC: 4.19 MIL/uL (ref 3.80–5.20)
RDW: 14.6 % — ABNORMAL HIGH (ref 11.5–14.5)
WBC: 7.5 10*3/uL (ref 3.6–11.0)

## 2017-08-30 LAB — BASIC METABOLIC PANEL
ANION GAP: 9 (ref 5–15)
BUN: 17 mg/dL (ref 6–20)
CHLORIDE: 95 mmol/L — AB (ref 101–111)
CO2: 24 mmol/L (ref 22–32)
Calcium: 9.7 mg/dL (ref 8.9–10.3)
Creatinine, Ser: 1.02 mg/dL — ABNORMAL HIGH (ref 0.44–1.00)
GFR calc Af Amer: 55 mL/min — ABNORMAL LOW (ref >60)
GFR calc non Af Amer: 47 mL/min — ABNORMAL LOW (ref >60)
GLUCOSE: 68 mg/dL (ref 65–99)
POTASSIUM: 3.9 mmol/L (ref 3.5–5.1)
Sodium: 128 mmol/L — ABNORMAL LOW (ref 135–145)

## 2017-08-30 LAB — GLUCOSE, CAPILLARY
GLUCOSE-CAPILLARY: 62 mg/dL — AB (ref 65–99)
GLUCOSE-CAPILLARY: 75 mg/dL (ref 65–99)
Glucose-Capillary: 61 mg/dL — ABNORMAL LOW (ref 65–99)
Glucose-Capillary: 83 mg/dL (ref 65–99)

## 2017-08-30 MED ORDER — CEPHALEXIN 500 MG PO CAPS: 500.0000 mg | ORAL_CAPSULE | Freq: Two times a day (BID) | ORAL | 0 refills | Status: AC

## 2017-08-30 MED ORDER — HEPARIN SODIUM (PORCINE) 5000 UNIT/ML IJ SOLN
5000.0000 [IU] | Freq: Three times a day (TID) | INTRAMUSCULAR | Status: DC
  Administered 2017-08-30 – 2017-08-31 (×2): 5000 [IU] via SUBCUTANEOUS
  Filled 2017-08-30 (×2): qty 1

## 2017-08-30 MED ORDER — ENOXAPARIN SODIUM 30 MG/0.3ML ~~LOC~~ SOLN: 30.0000 mg | SUBCUTANEOUS | Status: DC

## 2017-08-30 MED ORDER — DEXTROSE 5 % IV SOLN
1.0000 g | Freq: Every day | INTRAVENOUS | Status: DC
  Administered 2017-08-30 – 2017-08-31 (×2): 1 g via INTRAVENOUS
  Filled 2017-08-30 (×3): qty 10

## 2017-08-30 NOTE — Care Management Obs Status (Signed)
[place] NOTIFICATION   Patient Details  Name: Samantha Bowen MRN: [id number] Date of Birth: 21-Nov-1925   Medicare Observation Status Notification Given:  Yes Daughter [facility]    Shelbie Ammons, RN 08/30/2017, 12:09 PM

## 2017-08-30 NOTE — Progress Notes (Signed)
Samantha Bowen at [place] NAME: Samantha Bowen    MR#:  [id number]  DATE OF BIRTH:  November 30, 1925  SUBJECTIVE:  CHIEF COMPLAINT:   Chief Complaint  Patient presents with  . Hyponatremia     Recent UTi, Pelvic fracture, came with weakness- found to have dehydration and hyponatremia. No complains today.  REVIEW OF SYSTEMS:  CONSTITUTIONAL: No fever,have  fatigue or weakness.  EYES: No blurred or double vision.  EARS, NOSE, AND THROAT: No tinnitus or ear pain.  RESPIRATORY: No cough, shortness of breath, wheezing or hemoptysis.  CARDIOVASCULAR: No chest pain, orthopnea, edema.  GASTROINTESTINAL: No nausea, vomiting, diarrhea or abdominal pain.  GENITOURINARY: No dysuria, hematuria.  ENDOCRINE: No polyuria, nocturia,  HEMATOLOGY: No anemia, easy bruising or bleeding SKIN: No rash or lesion. MUSCULOSKELETAL: No joint pain or arthritis.   NEUROLOGIC: No tingling, numbness, weakness.  PSYCHIATRY: No anxiety or depression.   ROS  DRUG ALLERGIES:   Allergies  Allergen Reactions  . Cephalexin Other (See Comments)  . Ciprofloxacin Other (See Comments)  . Hydrocodone-Chlorpheniramine Other (See Comments)    Caused excessive sleepiness  . Metronidazole Other (See Comments)  . Sulfa Antibiotics Other (See Comments)  . Sulfamethoxazole-Trimethoprim     Other reaction(s): Unknown  . Tetracyclines & Related Other (See Comments)  . Tussionex Pennkinetic Er [Hydrocod Polst-Cpm Polst Er] Other (See Comments)    VITALS:  Blood pressure 140/76, pulse (!) 102, temperature 98 F (36.7 C), temperature source Oral, resp. rate 18, height 5\' 2"  (1.575 m), weight 49 kg (108 lb), SpO2 99 %.  PHYSICAL EXAMINATION:  GENERAL:  83 y.o.-year-old patient lying in the bed with no acute distress.  EYES: Pupils equal, round, reactive to light and accommodation. No scleral icterus. Extraocular muscles intact.  HEENT: Head atraumatic, normocephalic. Oropharynx and  nasopharynx clear.  NECK:  Supple, no jugular venous distention. No thyroid enlargement, no tenderness.  LUNGS: Normal breath sounds bilaterally, no wheezing, rales,rhonchi or crepitation. No use of accessory muscles of respiration.  CARDIOVASCULAR: S1, S2 normal. No murmurs, rubs, or gallops.  ABDOMEN: Soft, nontender, nondistended. Bowel sounds present. No organomegaly or mass.  EXTREMITIES: No pedal edema, cyanosis, or clubbing.  NEUROLOGIC: Cranial nerves II through XII are intact. Muscle strength 4-5/5 in all extremities. Sensation intact. Gait not checked.  PSYCHIATRIC: The patient is alert and oriented x 3.  SKIN: No obvious rash, lesion, or ulcer.   Physical Exam LABORATORY PANEL:   CBC Recent Labs  Lab 08/30/17 0437  WBC 7.5  HGB 12.9  HCT 37.8  PLT 262   ------------------------------------------------------------------------------------------------------------------  Chemistries  Recent Labs  Lab 08/29/17 1143 08/30/17 0437  NA 125* 128*  K 4.0 3.9  CL 89* 95*  CO2 23 24  GLUCOSE 93 68  BUN 20 17  CREATININE 0.97 1.02*  CALCIUM 10.1 9.7  AST 45*  --   ALT 34  --   ALKPHOS 313*  --   BILITOT 0.6  --    ------------------------------------------------------------------------------------------------------------------  Cardiac Enzymes No results for input(s): TROPONINI in the last 168 hours. ------------------------------------------------------------------------------------------------------------------  RADIOLOGY:  No results found.  ASSESSMENT AND PLAN:   Active Problems:   Hyponatremia   Pressure injury of skin   * UTI   IV rocephin, that is listed as her allergy, but she tolerated in last admission, full course of cephalosporin. Follow urine cx.  * Hyponatremia    Likely dehydration   Monitor with IV fluids  * Htn   Cont amlodipine  and enalapril.  * Hypothyroidism    Cont synthroid.  * hypoglycemia    She had episode of  Hypoglycemia- Monitored.    SLP eval done.    All the records are reviewed and case discussed with Care Management/Social Workerr. Management plans discussed with the patient, family and they are in agreement.  CODE STATUS: DNR  TOTAL TIME TAKING CARE OF THIS PATIENT: 35 minutes.   Discussed with her daughter in room.Likely d.c tomorrow, if Blood sugar stable and SLP eval need to be followed.  POSSIBLE D/C IN 1-2 DAYS, DEPENDING ON CLINICAL CONDITION.   Vaughan Basta M.D on 08/30/2017   Between 7am to 6pm - Pager - [phone]  After 6pm go to [email or web address] - password [email or web address] [facility]  Office  [phone]  CC: Primary care physician; Dion Body, MD  Note: This dictation was prepared with Dragon dictation along with smaller phrase technology. Any transcriptional errors that result from this process are unintentional.

## 2017-08-30 NOTE — Progress Notes (Signed)
Pharmacist - Provider Communication:  Order for enoxaparin 40 mg subQ daily for DVT prophylaxis was changed to enoxaparin 30 mg subQ daily.   Per protocol for CrCl < 30 mL/min.  Lenis Noon, PharmD, BCPS 08/30/17 10:12 AM

## 2017-08-30 NOTE — Progress Notes (Signed)
Clinical Social Worker (CSW) contacted patient's daughter Freda Munro and presented bed offers. Daughter chose [facility]. Daughter is agreeable to pay $800 for 2 weeks of co-pays up front to [facility] as they requested. Per daughter she will contact [facility] this afternoon and pay via credit card over the telephone. [facility] admissions coordinator at [facility] is aware of above. CSW will continue to follow and assist as needed.   [employer], LCSW [phone]

## 2017-08-30 NOTE — Progress Notes (Signed)
Plan is for patient to D/C to [facility] room 502 over the weekend pending medical clearance. Clinical Education officer, museum (CSW) sent D/C summary to [facility] today via [employer]. Per [facility] admissions coordinator at [facility] patient's daughter Freda Munro has paid $800 co-pays up front today. Per Magda Paganini patient can come to [facility] when stable. Patient's daughter Freda Munro is aware of above. CSW will continue to follow and assist as needed.   [employer], LCSW [phone]

## 2017-08-30 NOTE — Evaluation (Addendum)
Clinical/Bedside Swallow Evaluation Patient Details  Name: Samantha Bowen MRN: [id number] Date of Birth: 1926/05/17  Today's Date: 08/30/2017 Time: SLP Start Time (ACUTE ONLY): 7829 SLP Stop Time (ACUTE ONLY): 1645 SLP Time Calculation (min) (ACUTE ONLY): 60 min  Past Medical History:  Past Medical History:  Diagnosis Date  . Borderline diabetes mellitus    A1c 5.9% 12/2013 - diet controlled  . Breast cancer ([place]) 09/15/2008   lumpectomy with Radiation  . Cancer [facility])    Breast Cancer  . Chronic renal failure    Baseline Cr  1.6 - 12/2013 followed by Dr. Holley Raring  . Ductal carcinoma of breast, stage 1 (HCC)    history followed by Dr. Gala Murdoch  . Esophageal reflux   . Gout   . High cholesterol   . Hyperlipidemia    LDL 126 08/2013  . Hypertension   . Hypothyroidism    TSH 2.35 12/2013  . Pneumonia, unspecified organism 09/2015  . Renal disorder   . Thyroid disease    Past Surgical History:  Past Surgical History:  Procedure Laterality Date  . ABDOMINAL HYSTERECTOMY    . BREAST BIOPSY Left 2009   stereo Bx   +  . BREAST LUMPECTOMY Left 2009   with radiation  . COLONOSCOPY  12/08/2009   2 polyps (aged out of further screening per Dr. Vira Agar)   HPI:  Pt is a 83 y.o. female with a known history of diabetes mellitus, breast cancer, chronic kidney disease, ductal carcinoma breast stage I, GERD, gout, hyperlipidemia, hypertension, hypothyroidism, thyroid disease presented to the emergency room for weakness. Patient also has a recent history of a Fall w/ pelvic fractures and has been at [facility] for Rehab. And patient was seen in the emergency room she was awake and alert and responded to verbal commands. She is hard of hearing. Patient has low sodium level of 125. She has been not eating well and drinking enough fluids per report. Patient had a history of hyponatremia in the past. Patient is a hard of hearing and uses hearing aids. Pt is c/o Pills and some foods "sticking" at her  sternal notch area(where she was pointing when asked about this). Unsure of pt's baseline Cognitive status; noted Head CT results. She is alert to self and can answer general questions including follow general commands. She appears distracted at times. Pt told NSG she needed to "burp" to "feel better".   Assessment / Plan / Recommendation Clinical Impression  Pt appears to present w/ fairly adequate oropharyngeal phase swallowing function w/ reduced risk for aspiration when following general aspiration precautions. Pt and family endorses pt's c/o "food sticking" at her Sternal Notch area - Cervical Esophagus. Pt does have baseline GERD per chart which may be causing Esophageal dysmotility. When pt consumed thin liquids via Straw(preferred) using small, single sips and taking her time b/t sips, pt demonstrated no overt coughing/throat clearing or other overt s/s of aspiration. Pt was able to consume puree trials and softened soft foods w/ adequate oral phase managment and clearing given time. Pt then swallowed w/ no pharyngeal phase c/o discomfort or s/s of aspiration. Pt was given trials to alternate foods/liquids to aid clearing. Pt was able to feed self given setup support and cues. Education given to family present re: aspiration precautions; food consistency and preparation; and need to monitor for behavior and use aspiration and REFLUX precautions. Recommended a dysphagia level 3 w/ MINCED meats w/ gravy; thin liquids; Pills Crushed in Puree as able for easier  swallowing/clearing or medications in liquid form. Recommend GI f/u for assessment of the Cervical Esophagus and a Dietician consult for nutritional support d/t decreased oral intake since extended illness. NSG updated.  SLP Visit Diagnosis: Dysphagia, pharyngoesophageal phase (R13.14)    Aspiration Risk  (reduced following aspiration precautions)    Diet Recommendation  Dysphagia level 3 (Minced Meats w/ gravy);  Thin liquids.  Aspiration  precautions; REFLUX precautions.  Medication Administration: Crushed with puree(or in liquid form)    Other  Recommendations Recommended Consults: Consider GI evaluation;Consider esophageal assessment(Dietician f/u d/t decreased oral intake) Oral Care Recommendations: Oral care BID;Patient independent with oral care;Staff/trained caregiver to provide oral care   Follow up Recommendations None      Frequency and Duration (n/a)  (n/a)       Prognosis Prognosis for Safe Diet Advancement: Fair(-Good) Barriers to Reach Goals: Time post onset      Swallow Study   General Date of Onset: 08/29/17 HPI: Pt is a 83 y.o. female with a known history of diabetes mellitus, breast cancer, chronic kidney disease, ductal carcinoma breast stage I, GERD, gout, hyperlipidemia, hypertension, hypothyroidism, thyroid disease presented to the emergency room for weakness. Patient also has a recent history of a Fall w/ pelvic fractures and has been at [facility] for Rehab. And patient was seen in the emergency room she was awake and alert and responded to verbal commands. She is hard of hearing. Patient has low sodium level of 125. She has been not eating well and drinking enough fluids per report. Patient had a history of hyponatremia in the past. Patient is a hard of hearing and uses hearing aids. Pt is c/o Pills and some foods "sticking" at her sternal notch area(where she was pointing when asked about this). Unsure of pt's baseline Cognitive status; noted Head CT results. She is alert to self and can answer general questions including follow general commands. She appears distracted at times.  Type of Study: Bedside Swallow Evaluation Previous Swallow Assessment: none reported Diet Prior to this Study: Regular;Thin liquids Temperature Spikes Noted: No(wbc 7.5;  Sodium low at 128) Respiratory Status: Room air History of Recent Intubation: No Behavior/Cognition: Alert;Cooperative;Pleasant mood;Distractible;Requires  cueing(HOH) Oral Cavity Assessment: Within Functional Limits Oral Care Completed by SLP: Recent completion by staff Oral Cavity - Dentition: Adequate natural dentition(top partial) Vision: Functional for self-feeding Self-Feeding Abilities: Able to feed self;Needs assist;Needs set up Patient Positioning: Upright in bed Baseline Vocal Quality: Normal Volitional Cough: Strong Volitional Swallow: Able to elicit    Oral/Motor/Sensory Function Overall Oral Motor/Sensory Function: Within functional limits   Ice Chips Ice chips: Not tested   Thin Liquid Thin Liquid: Within functional limits Presentation: Self Fed;Straw(preferred to use straw not cup;  6 trials) Other Comments: multiple sips at times    Nectar Thick Nectar Thick Liquid: Not tested   Honey Thick Honey Thick Liquid: Not tested   Puree Puree: Within functional limits Presentation: Self Fed;Spoon(4 trials)   Solid   GO   Solid: Impaired Presentation: Self Fed(3 trials of mech soft(moistend foods)) Oral Phase Impairments: Impaired mastication(min extra time) Oral Phase Functional Implications: Impaired mastication Pharyngeal Phase Impairments: (none) Other Comments: no c/o immediate food sticking in sternal notch area w/ trials given    Functional Assessment Tool Used: clinical judgement Functional Limitations: Swallowing Swallow Current Status (K0254): At least 1 percent but less than 20 percent impaired, limited or restricted Swallow Goal Status [id number]): At least 1 percent but less than 20 percent impaired, limited or restricted Swallow Discharge  Status [phone]): At least 1 percent but less than 20 percent impaired, limited or restricted     Orinda Kenner, MS, CCC-SLP Watson,Katherine 08/30/2017,5:19 PM

## 2017-08-30 NOTE — Discharge Summary (Signed)
[facility] [place] NAME: Samantha Bowen    MR#:  [id number]  DATE OF BIRTH:  03-02-1926  DATE OF ADMISSION:  08/29/2017 ADMITTING PHYSICIAN: Saundra Shelling, MD  DATE OF DISCHARGE:  PRIMARY CARE PHYSICIAN: Dion Body, MD    ADMISSION DIAGNOSIS:  Dehydration [E86.0] Hyponatremia [E87.1] Altered mental status, unspecified altered mental status type [R41.82]  DISCHARGE DIAGNOSIS:  Active Problems:   Hyponatremia   Pressure injury of skin    UTI  SECONDARY DIAGNOSIS:   Past Medical History:  Diagnosis Date  . Borderline diabetes mellitus    A1c 5.9% 12/2013 - diet controlled  . Breast cancer ([place]) 09/15/2008   lumpectomy with Radiation  . Cancer [facility])    Breast Cancer  . Chronic renal failure    Baseline Cr  1.6 - 12/2013 followed by Dr. Holley Raring  . Ductal carcinoma of breast, stage 1 (HCC)    history followed by Dr. Gala Murdoch  . Esophageal reflux   . Gout   . High cholesterol   . Hyperlipidemia    LDL 126 08/2013  . Hypertension   . Hypothyroidism    TSH 2.35 12/2013  . Pneumonia, unspecified organism 09/2015  . Renal disorder   . Thyroid disease     HOSPITAL COURSE:   * UTI   IV rocephin, that is listed as her allergy, but she tolerated in last admission, full course of cephalosporin. Follow urine cx.  * Hyponatremia    Likely dehydration   Monitor with IV fluids  * Htn   Cont amlodipine and enalapril.  * Hypothyroidism    Cont synthroid.  * hypoglycemia    She had episode of Hypoglycemia- Monitored.    SLP eval done.  DISCHARGE CONDITIONS:   Stable.  CONSULTS OBTAINED:    DRUG ALLERGIES:   Allergies  Allergen Reactions  . Cephalexin Other (See Comments)  . Ciprofloxacin Other (See Comments)  . Hydrocodone-Chlorpheniramine Other (See Comments)    Caused excessive sleepiness  . Metronidazole Other (See Comments)  . Sulfa Antibiotics Other (See Comments)  .  Sulfamethoxazole-Trimethoprim     Other reaction(s): Unknown  . Tetracyclines & Related Other (See Comments)  . Tussionex Pennkinetic Er [Hydrocod Polst-Cpm Polst Er] Other (See Comments)    DISCHARGE MEDICATIONS:   Allergies as of 08/30/2017      Reactions   Cephalexin Other (See Comments)   Ciprofloxacin Other (See Comments)   Hydrocodone-chlorpheniramine Other (See Comments)   Caused excessive sleepiness   Metronidazole Other (See Comments)   Sulfa Antibiotics Other (See Comments)   Sulfamethoxazole-trimethoprim    Other reaction(s): Unknown   Tetracyclines & Related Other (See Comments)   Tussionex Pennkinetic Er [hydrocod Polst-cpm Polst Er] Other (See Comments)      Medication List    TAKE these medications   acetaminophen 500 MG tablet Commonly known as:  TYLENOL Take 500 mg by mouth 4 (four) times daily.   allopurinol 100 MG tablet Commonly known as:  ZYLOPRIM Take 100 mg by mouth daily.   amLODipine 5 MG tablet Commonly known as:  NORVASC Take 5 mg by mouth daily.   aspirin 325 MG EC tablet Take 325 mg by mouth 2 (two) times daily.   cephALEXin 500 MG capsule Commonly known as:  KEFLEX Take 1 capsule (500 mg total) by mouth 2 (two) times daily for 4 days.   Cholecalciferol 4000 units Caps Take 1 capsule by mouth daily.   enalapril 20 MG tablet Commonly known as:  VASOTEC Take 20 mg by mouth 2 (two) times daily.   levothyroxine 112 MCG tablet Commonly known as:  SYNTHROID, LEVOTHROID Take 112 mcg by mouth daily before breakfast.   omeprazole 20 MG tablet Commonly known as:  PRILOSEC OTC Take 20 mg by mouth daily as needed. For heartburn/indigestion.   traMADol 50 MG tablet Commonly known as:  ULTRAM Take 1 tablet (50 mg total) by mouth every 12 (twelve) hours as needed for moderate pain or severe pain.        DISCHARGE INSTRUCTIONS:    Follow with PMD in 1-2 weeks.  If you experience worsening of your admission symptoms, develop  shortness of breath, life threatening emergency, suicidal or homicidal thoughts you must seek medical attention immediately by calling 911 or calling your MD immediately  if symptoms less severe.  You Must read complete instructions/literature along with all the possible adverse reactions/side effects for all the Medicines you take and that have been prescribed to you. Take any new Medicines after you have completely understood and accept all the possible adverse reactions/side effects.   Please note  You were cared for by a hospitalist during your hospital stay. If you have any questions about your discharge medications or the care you received while you were in the hospital after you are discharged, you can call the unit and asked to speak with the hospitalist on call if the hospitalist that took care of you is not available. Once you are discharged, your primary care physician will handle any further medical issues. Please note that NO REFILLS for any discharge medications will be authorized once you are discharged, as it is imperative that you return to your primary care physician (or establish a relationship with a primary care physician if you do not have one) for your aftercare needs so that they can reassess your need for medications and monitor your lab values.    Today   CHIEF COMPLAINT:   Chief Complaint  Patient presents with  . Hyponatremia    HISTORY OF PRESENT ILLNESS:  Samantha Bowen  is a 83 y.o. female with a known history of diabetes mellitus, breast cancer, chronic kidney disease, ductal carcinoma breast stage I, GERD, gout, hyperlipidemia, hypertension, hypothyroidism, thyroid disease presented to the emergency room for weakness. Patient also has a recent history of pelvic fractures. And patient was seen in the emergency room she was awake and alert and responded to verbal commands. She is hard of hearing. Patient has low sodium level of 125. She has been not eating well and  drinking enough fluids. [facility] of [facility]. Patient had a history of hyponatremia in the past. Patient is a hard of hearing and uses hearing aids. No complaints of any chest pain, shortness of breath. No fever and chills. Hospitalist service was consulted for further care.   VITAL SIGNS:  Blood pressure (!) 145/72, pulse 97, temperature 98 F (36.7 C), temperature source Oral, resp. rate 18, height 5\' 2"  (1.575 m), weight 49 kg (108 lb), SpO2 99 %.  I/O:    Intake/Output Summary (Last 24 hours) at 08/30/2017 1354 Last data filed at 08/30/2017 0900 Gross per 24 hour  Intake 2015 ml  Output 1000 ml  Net 1015 ml    PHYSICAL EXAMINATION:  GENERAL:  83 y.o.-year-old patient lying in the bed with no acute distress.  EYES: Pupils equal, round, reactive to light and accommodation. No scleral icterus. Extraocular muscles intact.  HEENT: Head atraumatic, normocephalic. Oropharynx and  nasopharynx clear.  NECK:  Supple, no jugular venous distention. No thyroid enlargement, no tenderness.  LUNGS: Normal breath sounds bilaterally, no wheezing, rales,rhonchi or crepitation. No use of accessory muscles of respiration.  CARDIOVASCULAR: S1, S2 normal. No murmurs, rubs, or gallops.  ABDOMEN: Soft, non-tender, non-distended. Bowel sounds present. No organomegaly or mass.  EXTREMITIES: No pedal edema, cyanosis, or clubbing.  NEUROLOGIC: Cranial nerves II through XII are intact. Muscle strength 4-5/5 in all extremities. Sensation intact. Gait not checked.  PSYCHIATRIC: The patient is alert and oriented x 3.  SKIN: No obvious rash, lesion, or ulcer.   DATA REVIEW:   CBC Recent Labs  Lab 08/30/17 0437  WBC 7.5  HGB 12.9  HCT 37.8  PLT 262    Chemistries  Recent Labs  Lab 08/29/17 1143 08/30/17 0437  NA 125* 128*  K 4.0 3.9  CL 89* 95*  CO2 23 24  GLUCOSE 93 68  BUN 20 17  CREATININE 0.97 1.02*  CALCIUM 10.1 9.7  AST 45*  --   ALT 34  --   ALKPHOS 313*  --    BILITOT 0.6  --     Cardiac Enzymes No results for input(s): TROPONINI in the last 168 hours.  Microbiology Results  Results for orders placed or performed during the hospital encounter of 08/20/17  Urine Culture     Status: Abnormal   Collection Time: 08/20/17  5:01 PM  Result Value Ref Range Status   Specimen Description URINE, RANDOM  Final   Special Requests NONE  Final   Culture (A)  Final    <10,000 COLONIES/mL INSIGNIFICANT GROWTH Performed at [facility], [place] [address], [place], [place] [address]    Report Status 08/22/2017 FINAL  Final    RADIOLOGY:  No results found.  EKG:   Orders placed or performed during the hospital encounter of 08/29/17  . ED EKG  . ED EKG  . EKG 12-Lead  . EKG 12-Lead      Management plans discussed with the patient, family and they are in agreement.  CODE STATUS:     Code Status Orders  (From admission, onward)        Start     Ordered   08/29/17 1737  Do not attempt resuscitation (DNR)  Continuous    Question Answer Comment  In the event of cardiac or respiratory ARREST Do not call a "code blue"   In the event of cardiac or respiratory ARREST Do not perform Intubation, CPR, defibrillation or ACLS   In the event of cardiac or respiratory ARREST Use medication by any route, position, wound care, and other measures to relive pain and suffering. May use oxygen, suction and manual treatment of airway obstruction as needed for comfort.   Comments nurse may pronounce      08/29/17 1736    Code Status History    Date Active Date Inactive Code Status Order ID Comments User Context   08/11/2017 16:14 08/13/2017 14:00 DNR [id number]  Loletha Grayer, MD ED   01/16/2016 00:44 01/16/2016 18:47 Partial Code [id number]  Lance Coon, MD Inpatient   01/15/2016 22:38 01/16/2016 00:44 DNR [id number]  Nicholes Mango, MD Inpatient    Advance Directive Documentation     Most Recent Value  Type of Advance Directive  Out of facility DNR (pink  MOST or yellow form), Healthcare Power of Attorney, Living will  Pre-existing out of facility DNR order (yellow form or pink MOST form)  No data  "MOST" Form  in Place?  No data      TOTAL TIME TAKING CARE OF THIS PATIENT: 35 minutes.    Vaughan Basta M.D on 08/30/2017 at 1:54 PM  Between 7am to 6pm - Pager - [phone]  After 6pm go to [email or web address] - password [email or web address] [facility]  Office  [phone]  CC: Primary care physician; Dion Body, MD   Note: This dictation was prepared with Dragon dictation along with smaller phrase technology. Any transcriptional errors that result from this process are unintentional.

## 2017-08-31 LAB — BASIC METABOLIC PANEL
ANION GAP: 8 (ref 5–15)
BUN: 14 mg/dL (ref 6–20)
CALCIUM: 9.8 mg/dL (ref 8.9–10.3)
CO2: 24 mmol/L (ref 22–32)
Chloride: 100 mmol/L — ABNORMAL LOW (ref 101–111)
Creatinine, Ser: 0.92 mg/dL (ref 0.44–1.00)
GFR calc Af Amer: >60 mL/min (ref >60)
GFR, EST NON AFRICAN AMERICAN: 54 mL/min — AB (ref >60)
GLUCOSE: 84 mg/dL (ref 65–99)
Potassium: 4 mmol/L (ref 3.5–5.1)
Sodium: 132 mmol/L — ABNORMAL LOW (ref 135–145)

## 2017-08-31 LAB — GLUCOSE, CAPILLARY
GLUCOSE-CAPILLARY: 85 mg/dL (ref 65–99)
Glucose-Capillary: 82 mg/dL (ref 65–99)
Glucose-Capillary: 89 mg/dL (ref 65–99)

## 2017-08-31 NOTE — Clinical Social Work Note (Signed)
Patient will discharge today via non-emergent EMS to [facility]. The facility is aware and the attending RN will contact the patient's family. CSW will deliver the packet when able.   Santiago Bumpers, MSW, Latanya Presser [phone]

## 2017-08-31 NOTE — Progress Notes (Signed)
Patient is being discharged to [facility] today. I spoke with her daughter Freda Munro who has no objections to patient being transferred to the facility today. Patient nor daughter have any concerns.

## 2017-08-31 NOTE — Progress Notes (Signed)
Patient left at 1810 via ems  and was accompanied by her son.

## 2017-08-31 NOTE — Discharge Summary (Signed)
[facility] [place] NAME: Natane Heward    MR#:  [id number]  DATE OF BIRTH:  15-Jul-1926  DATE OF ADMISSION:  08/29/2017 ADMITTING PHYSICIAN: Saundra Shelling, MD  DATE OF DISCHARGE: 08/31/2017  PRIMARY CARE PHYSICIAN: Dion Body, MD    ADMISSION DIAGNOSIS:  Dehydration [E86.0] Hyponatremia [E87.1] Altered mental status, unspecified altered mental status type [R41.82]  DISCHARGE DIAGNOSIS:  Active Problems:   Hyponatremia   Pressure injury of skin   SECONDARY DIAGNOSIS:   Past Medical History:  Diagnosis Date  . Borderline diabetes mellitus    A1c 5.9% 12/2013 - diet controlled  . Breast cancer ([facility]) 09/15/2008   lumpectomy with Radiation  . Cancer [facility])    Breast Cancer  . Chronic renal failure    Baseline Cr  1.6 - 12/2013 followed by Dr. Holley Raring  . Ductal carcinoma of breast, stage 1 (HCC)    history followed by Dr. Gala Murdoch  . Esophageal reflux   . Gout   . High cholesterol   . Hyperlipidemia    LDL 126 08/2013  . Hypertension   . Hypothyroidism    TSH 2.35 12/2013  . Pneumonia, unspecified organism 09/2015  . Renal disorder   . Thyroid disease     HOSPITAL COURSE:   83 year old female with past medical history of breast cancer, diabetes, chronic kidney disease stage III, GERD, gout, hyperlipidemia, hypertension, hypothyroidism who presented to the hospital due to generalized weakness and noted to be hyponatremic.  1. Generalized weakness/falls-secondary to hyponatremia and underlying deconditioning. -Sodium has improved and normalized with IV fluids. Patient was seen by physical therapy and recommended short-term rehabilitation which is where patient is presently being discharged.  2. Urinary tract infection-this is based off the urinalysis on admission. Urine cultures have been negative. While in the hospital patient was treated with IV ceftriaxone, now being discharged on oral Keflex.  3.  Hyponatremia-hypovolemic hypotonic in nature. Patient was given some gentle IV fluids and sodium has not improved and normalized.  4. Essential hypertension-patient will resume her Norvasc, enalapril.  5. Hypothyroidism-patient will resume her Synthroid.  6. History of gout no acute attack, patient will resume her allopurinol.  Patient is being discharged to a skilled nursing facility for ongoing care.   DISCHARGE CONDITIONS:   Stable  CONSULTS OBTAINED:    DRUG ALLERGIES:   Allergies  Allergen Reactions  . Cephalexin Other (See Comments)  . Ciprofloxacin Other (See Comments)  . Hydrocodone-Chlorpheniramine Other (See Comments)    Caused excessive sleepiness  . Metronidazole Other (See Comments)  . Sulfa Antibiotics Other (See Comments)  . Sulfamethoxazole-Trimethoprim     Other reaction(s): Unknown  . Tetracyclines & Related Other (See Comments)  . Tussionex Pennkinetic Er [Hydrocod Polst-Cpm Polst Er] Other (See Comments)    DISCHARGE MEDICATIONS:   Allergies as of 08/31/2017      Reactions   Cephalexin Other (See Comments)   Ciprofloxacin Other (See Comments)   Hydrocodone-chlorpheniramine Other (See Comments)   Caused excessive sleepiness   Metronidazole Other (See Comments)   Sulfa Antibiotics Other (See Comments)   Sulfamethoxazole-trimethoprim    Other reaction(s): Unknown   Tetracyclines & Related Other (See Comments)   Tussionex Pennkinetic Er [hydrocod Polst-cpm Polst Er] Other (See Comments)      Medication List    TAKE these medications   acetaminophen 500 MG tablet Commonly known as:  TYLENOL Take 500 mg by mouth 4 (four) times daily.   allopurinol 100 MG tablet Commonly known as:  ZYLOPRIM Take 100 mg by mouth daily.   amLODipine 5 MG tablet Commonly known as:  NORVASC Take 5 mg by mouth daily.   aspirin 325 MG EC tablet Take 325 mg by mouth 2 (two) times daily.   cephALEXin 500 MG capsule Commonly known as:  KEFLEX Take 1 capsule  (500 mg total) by mouth 2 (two) times daily for 4 days.   Cholecalciferol 4000 units Caps Take 1 capsule by mouth daily.   enalapril 20 MG tablet Commonly known as:  VASOTEC Take 20 mg by mouth 2 (two) times daily.   levothyroxine 112 MCG tablet Commonly known as:  SYNTHROID, LEVOTHROID Take 112 mcg by mouth daily before breakfast.   omeprazole 20 MG tablet Commonly known as:  PRILOSEC OTC Take 20 mg by mouth daily as needed. For heartburn/indigestion.   traMADol 50 MG tablet Commonly known as:  ULTRAM Take 1 tablet (50 mg total) by mouth every 12 (twelve) hours as needed for moderate pain or severe pain.         DISCHARGE INSTRUCTIONS:   DIET:  Cardiac diet  DISCHARGE CONDITION:  Stable  ACTIVITY:  Activity as tolerated  OXYGEN:  Home Oxygen: No.   Oxygen Delivery: room air  DISCHARGE LOCATION:  nursing home   If you experience worsening of your admission symptoms, develop shortness of breath, life threatening emergency, suicidal or homicidal thoughts you must seek medical attention immediately by calling 911 or calling your MD immediately  if symptoms less severe.  You Must read complete instructions/literature along with all the possible adverse reactions/side effects for all the Medicines you take and that have been prescribed to you. Take any new Medicines after you have completely understood and accpet all the possible adverse reactions/side effects.   Please note  You were cared for by a hospitalist during your hospital stay. If you have any questions about your discharge medications or the care you received while you were in the hospital after you are discharged, you can call the unit and asked to speak with the hospitalist on call if the hospitalist that took care of you is not available. Once you are discharged, your primary care physician will handle any further medical issues. Please note that NO REFILLS for any discharge medications will be authorized  once you are discharged, as it is imperative that you return to your primary care physician (or establish a relationship with a primary care physician if you do not have one) for your aftercare needs so that they can reassess your need for medications and monitor your lab values.     Today   No acute events overnight. Sodium improved with IV fluids.  VITAL SIGNS:  Blood pressure (!) 151/84, pulse (!) 104, temperature 98.4 F (36.9 C), temperature source Oral, resp. rate 16, height 5\' 2"  (1.575 m), weight 49 kg (108 lb), SpO2 96 %.  I/O:    Intake/Output Summary (Last 24 hours) at 08/31/2017 1249 Last data filed at 08/31/2017 0300 Gross per 24 hour  Intake 2134.75 ml  Output 1600 ml  Net 534.75 ml    PHYSICAL EXAMINATION:  GENERAL:  83 y.o.-year-old patient lying in the bed with no acute distress.  EYES: Pupils equal, round, reactive to light and accommodation. No scleral icterus. Extraocular muscles intact.  HEENT: Head atraumatic, normocephalic. Oropharynx and nasopharynx clear.  NECK:  Supple, no jugular venous distention. No thyroid enlargement, no tenderness.  LUNGS: Normal breath sounds bilaterally, no wheezing, rales,rhonchi. No use of accessory muscles of  respiration.  CARDIOVASCULAR: S1, S2 normal. No murmurs, rubs, or gallops.  ABDOMEN: Soft, non-tender, non-distended. Bowel sounds present. No organomegaly or mass.  EXTREMITIES: No pedal edema, cyanosis, or clubbing.  NEUROLOGIC: Cranial nerves II through XII are intact. No focal motor or sensory defecits b/l.  PSYCHIATRIC: The patient is alert and oriented x 3. SKIN: No obvious rash, lesion, or ulcer.   DATA REVIEW:   CBC Recent Labs  Lab 08/30/17 0437  WBC 7.5  HGB 12.9  HCT 37.8  PLT 262    Chemistries  Recent Labs  Lab 08/29/17 1143  08/31/17 0548  NA 125*   < > 132*  K 4.0   < > 4.0  CL 89*   < > 100*  CO2 23   < > 24  GLUCOSE 93   < > 84  BUN 20   < > 14  CREATININE 0.97   < > 0.92  CALCIUM  10.1   < > 9.8  AST 45*  --   --   ALT 34  --   --   ALKPHOS 313*  --   --   BILITOT 0.6  --   --    < > = values in this interval not displayed.    Cardiac Enzymes No results for input(s): TROPONINI in the last 168 hours.  Microbiology Results  Results for orders placed or performed during the hospital encounter of 08/20/17  Urine Culture     Status: Abnormal   Collection Time: 08/20/17  5:01 PM  Result Value Ref Range Status   Specimen Description URINE, RANDOM  Final   Special Requests NONE  Final   Culture (A)  Final    <10,000 COLONIES/mL INSIGNIFICANT GROWTH Performed at [facility], [place] [address] [place], [place] [address]    Report Status 08/22/2017 FINAL  Final    RADIOLOGY:  No results found.    Management plans discussed with the patient, family and they are in agreement.  CODE STATUS:     Code Status Orders  (From admission, onward)        Start     Ordered   08/29/17 1737  Do not attempt resuscitation (DNR)  Continuous    Question Answer Comment  In the event of cardiac or respiratory ARREST Do not call a "code blue"   In the event of cardiac or respiratory ARREST Do not perform Intubation, CPR, defibrillation or ACLS   In the event of cardiac or respiratory ARREST Use medication by any route, position, wound care, and other measures to relive pain and suffering. May use oxygen, suction and manual treatment of airway obstruction as needed for comfort.   Comments nurse may pronounce      08/29/17 1736      Advance Directive Documentation     Most Recent Value  Type of Advance Directive  Out of facility DNR (pink MOST or yellow form), Healthcare Power of Attorney, Living will  Pre-existing out of facility DNR order (yellow form or pink MOST form)  No data  "MOST" Form in Place?  No data      TOTAL TIME TAKING CARE OF THIS PATIENT: 40 minutes.    Henreitta Leber M.D on 08/31/2017 at 12:49 PM  Between 7am to 6pm - Pager -  [phone]  After 6pm go to [email or web address] - Proofreader  [facility]  Office  [phone]  CC: Primary care physician; Dion Body, MD

## 2017-08-31 NOTE — Progress Notes (Signed)
Report called to [place] at [facility]. Patient will be going to room 502

## 2017-09-01 LAB — URINE CULTURE

## 2018-08-17 DEATH — deceased

## 2019-02-25 IMAGING — CT CT PELVIS W/O CM
2 of 3 series · 16 of 46 positions shown, 18 images · non-contrast
Comparison: None.

CLINICAL DATA: 89 y/o  F; fall with right hip pain.

EXAM:
CT PELVIS WITHOUT CONTRAST
TECHNIQUE: Multidetector CT imaging of the pelvis was performed following the
standard protocol without intravenous contrast.

[Series 3: axial st · axial · 0.67mm/px · z∈[-337,-147]mm · 13 of 109 slices shown, 15 images]
[im 7/109  soft-tissue]
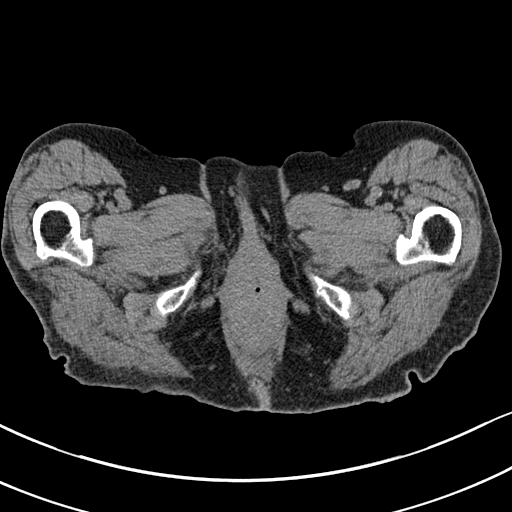
[im 7/109  bone]
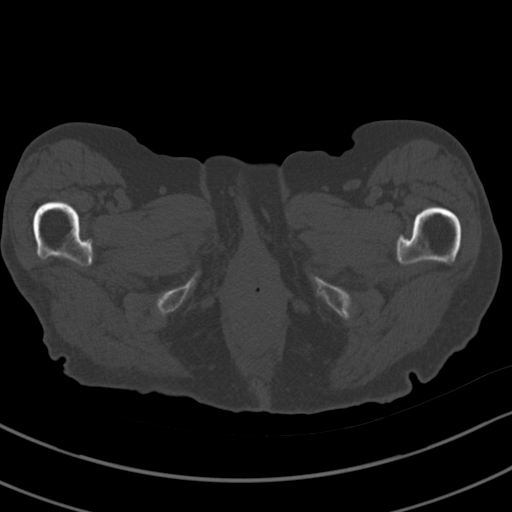
[im 14/109  soft-tissue]
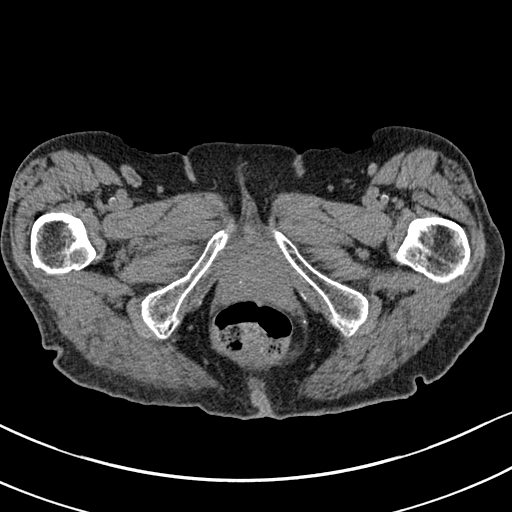
[im 21/109  soft-tissue]
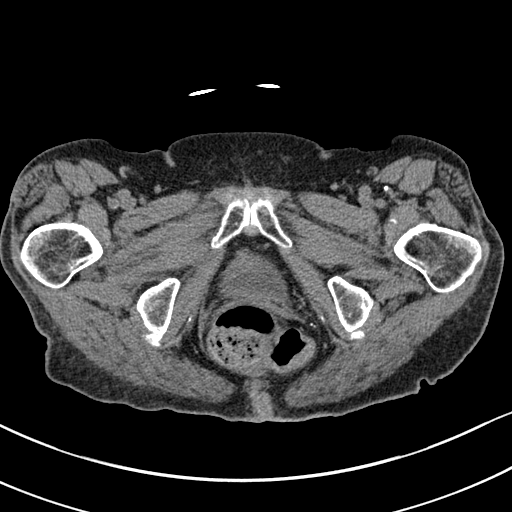
[im 32/109  soft-tissue]
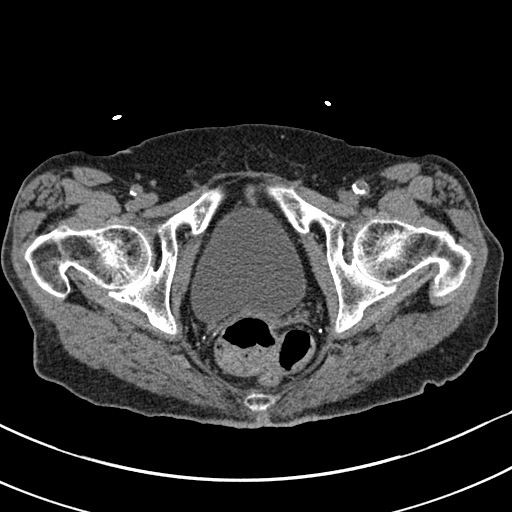
[im 39/109  soft-tissue]
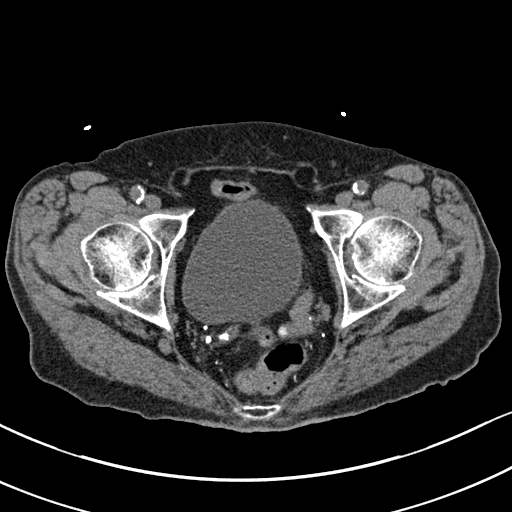
[im 46/109  soft-tissue]
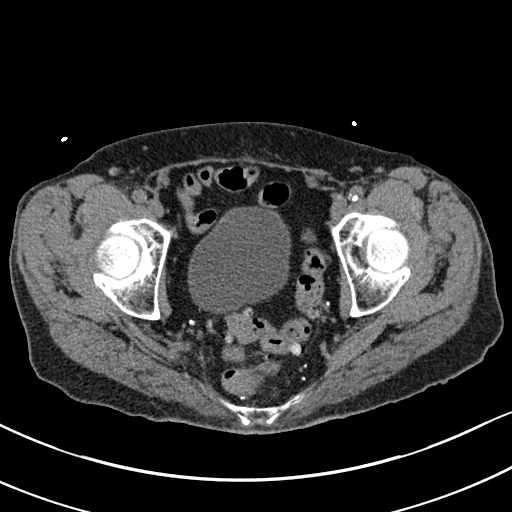
[im 56/109  soft-tissue]
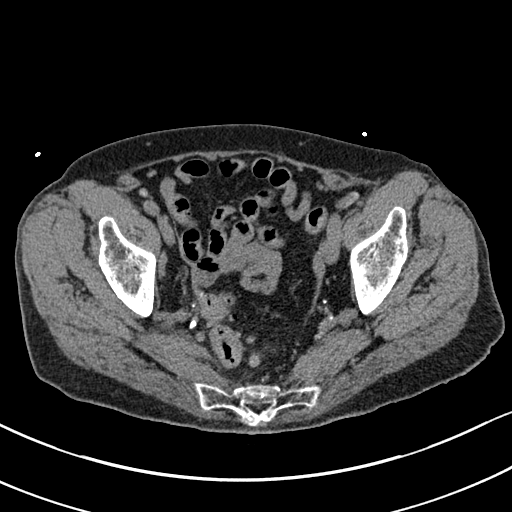
[im 63/109  soft-tissue]
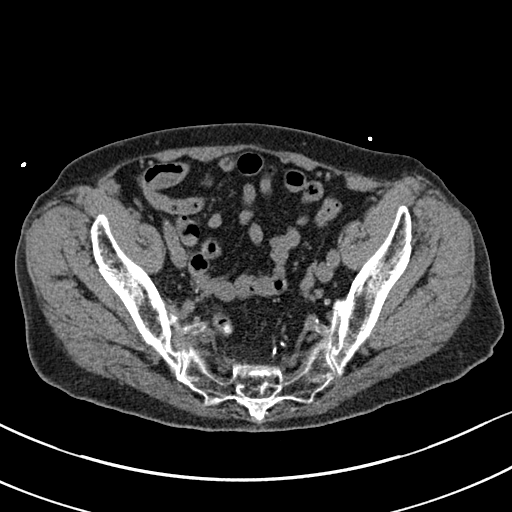
[im 70/109  soft-tissue]
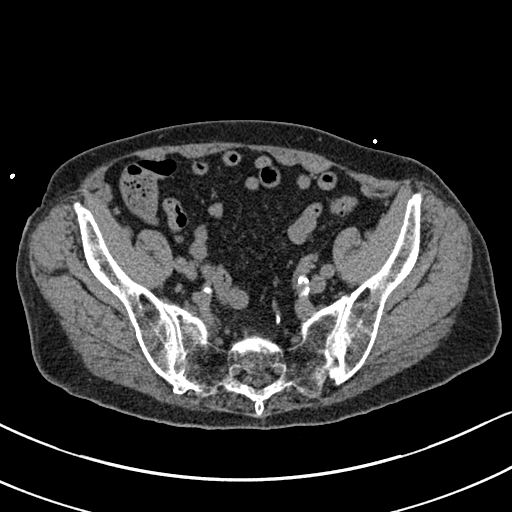
[im 70/109  bone]
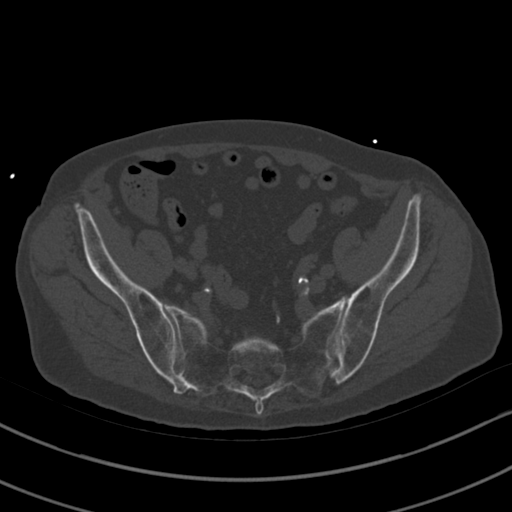
[im 77/109  soft-tissue]
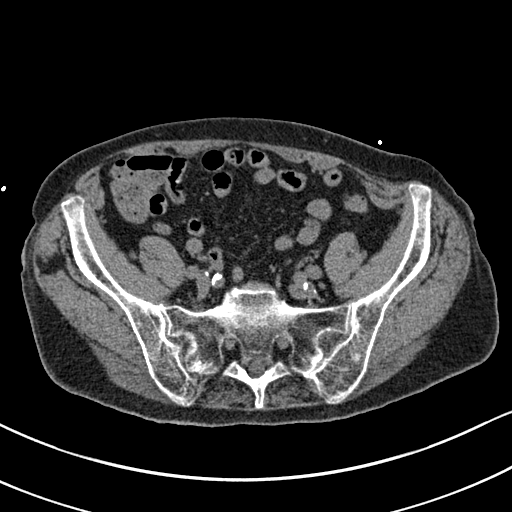
[im 88/109  soft-tissue]
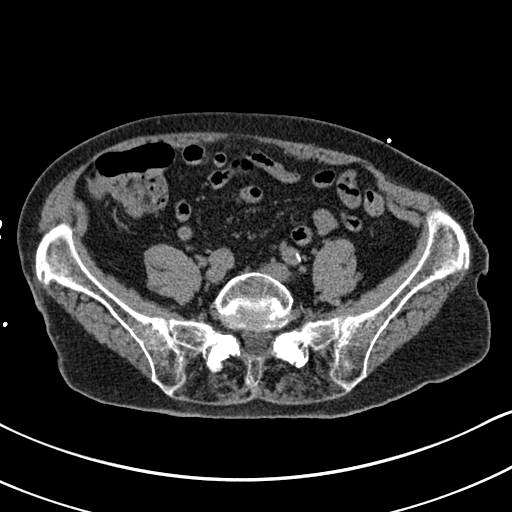
[im 95/109  soft-tissue]
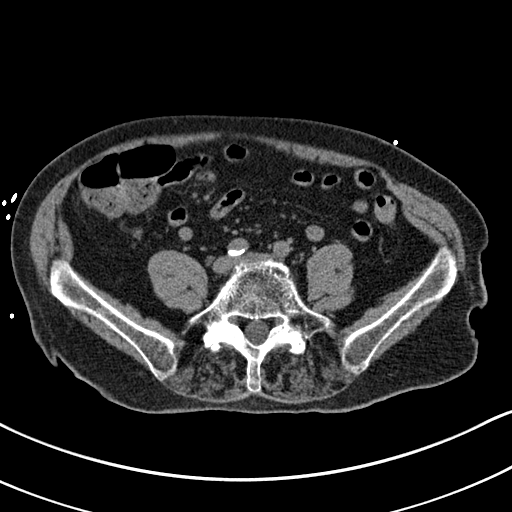
[im 102/109  soft-tissue]
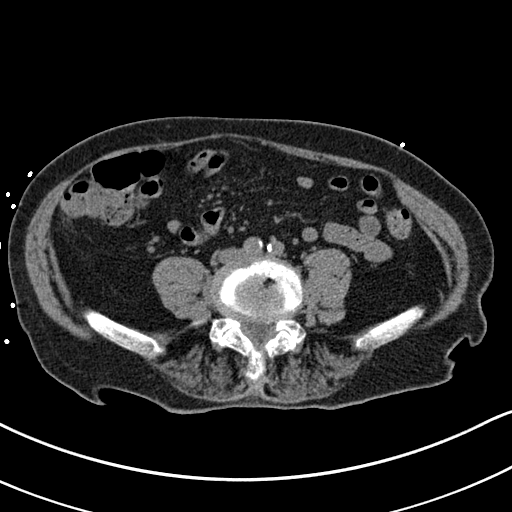

[Series 8: coronal st · coronal · 0.43mm/px · 3 of 108 slices shown]
[im 36/108  soft-tissue]
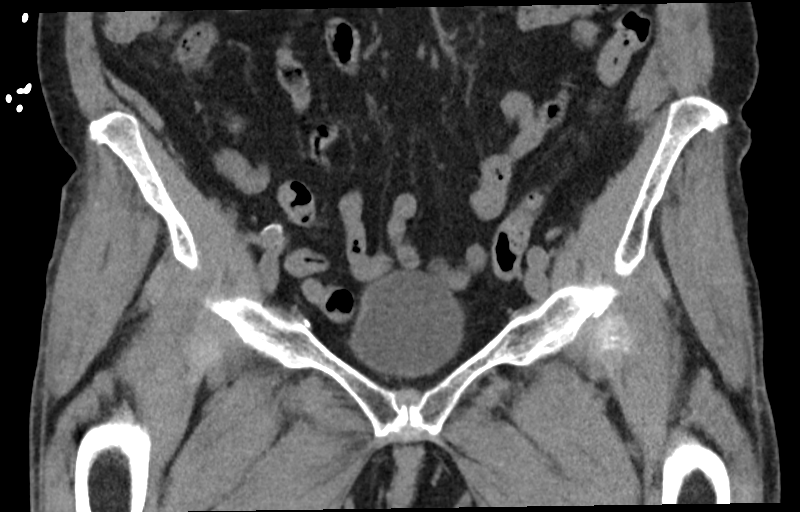
[im 48/108  soft-tissue]
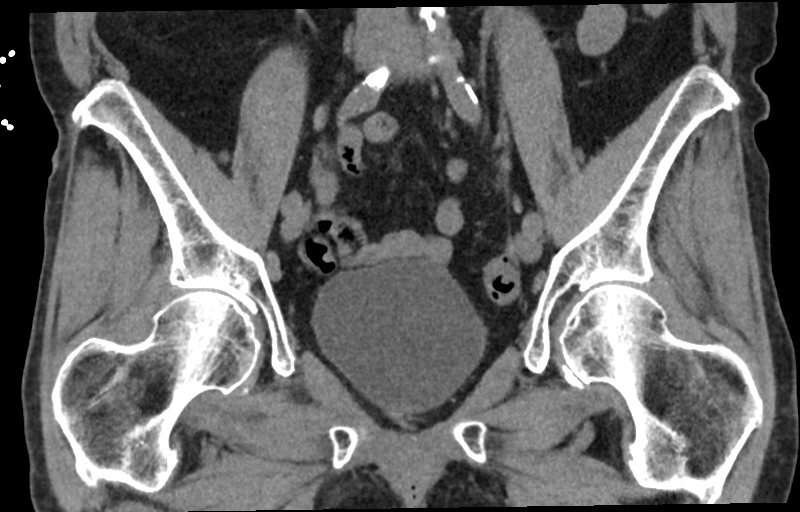
[im 60/108  soft-tissue]
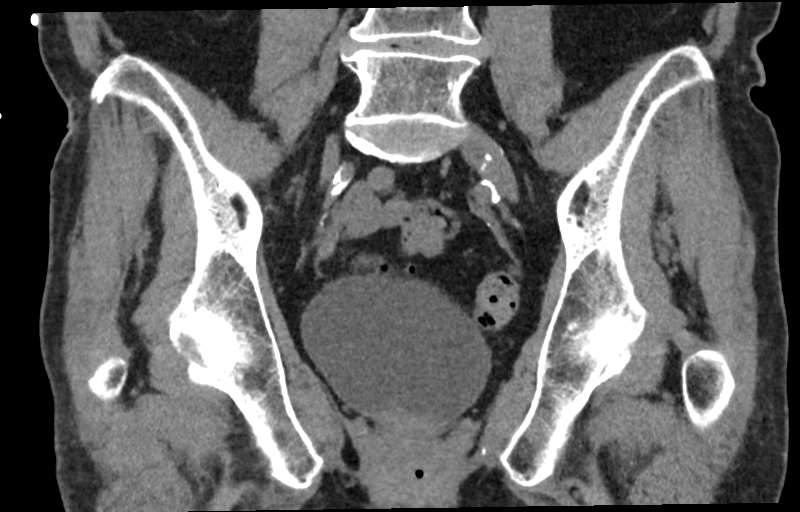

[16 of 46 positions shown; findings below may reference images not displayed]

FINDINGS: Urinary Tract:  No abnormality visualized.

Bowel:  Sigmoid diverticulosis.

Vascular/Lymphatic: Calcific atherosclerosis of the iliofemoral
arteries.

Reproductive: No mass or other significant abnormality.
Hysterectomy.

Other:  None.

Musculoskeletal: Acute mildly displaced fracture of inferior pubic
ramus near symphysis (series 2, image 94). Acute nondisplaced mild
fracture of left inferior pubic ramus near the ischial tuberosity
(series 2, image 100). Minimally displaced acute fracture right
sacral ala (series 2, image 36).
IMPRESSION: 1. Acute mildly displaced fracture of inferior pubic ramus near
symphysis and nondisplaced buckle fracture right inferior pubic
ramus near ischial tuberosity.
2. Acute minimally displaced fracture right sacral ala.

By: Marisheet Bzjxjdjkd M.D.
